# Patient Record
Sex: Male | Born: 2000 | Race: White | Hispanic: No | Marital: Single | Smoking: Never smoker
Health system: Southern US, Community
[De-identification: ages and names within clinical notes are randomized; demographics above are authoritative.]

## PROBLEM LIST (undated history)

## (undated) DIAGNOSIS — Q9388 Other microdeletions: Secondary | ICD-10-CM

## (undated) DIAGNOSIS — F840 Autistic disorder: Secondary | ICD-10-CM

## (undated) HISTORY — DX: Other microdeletions: Q93.88

---
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
## 2020-01-02 ENCOUNTER — Telehealth: Payer: Medicaid Other | Admitting: Family

## 2020-02-18 ENCOUNTER — Telehealth: Payer: Self-pay

## 2020-02-18 ENCOUNTER — Other Ambulatory Visit: Payer: Self-pay | Admitting: Pediatrics

## 2020-02-18 MED ORDER — GUANFACINE HCL ER 2 MG PO TB24: 2.0000 mg | ORAL_TABLET | Freq: Every day | ORAL | 1 refills | Status: DC

## 2020-02-18 MED ORDER — GUANFACINE HCL ER 1 MG PO TB24: 1.0000 mg | ORAL_TABLET | Freq: Every morning | ORAL | 1 refills | Status: DC

## 2020-02-18 NOTE — Telephone Encounter (Signed)
Done

## 2020-02-18 NOTE — Telephone Encounter (Signed)
Dad called requesting refill of medication. Routing to provider.

## 2020-02-20 ENCOUNTER — Telehealth: Payer: Medicaid Other | Admitting: Family

## 2020-03-20 ENCOUNTER — Other Ambulatory Visit: Payer: Self-pay | Admitting: Pediatrics

## 2020-03-21 ENCOUNTER — Telehealth: Payer: Medicaid Other | Admitting: Family

## 2020-04-24 ENCOUNTER — Telehealth: Payer: Self-pay | Admitting: Pediatrics

## 2020-04-24 ENCOUNTER — Other Ambulatory Visit: Payer: Self-pay | Admitting: Pediatrics

## 2020-04-24 NOTE — Telephone Encounter (Signed)
Needs Guanfacine 1 mg and 2 mg refilled please will be out on Sunday.

## 2020-04-24 NOTE — Telephone Encounter (Signed)
Meds refilled.

## 2020-06-30 ENCOUNTER — Other Ambulatory Visit: Payer: Self-pay | Admitting: Pediatrics

## 2020-09-17 ENCOUNTER — Other Ambulatory Visit: Payer: Self-pay | Admitting: Pediatrics

## 2020-12-04 ENCOUNTER — Other Ambulatory Visit: Payer: Self-pay | Admitting: Pediatrics

## 2020-12-20 ENCOUNTER — Other Ambulatory Visit: Payer: Self-pay

## 2020-12-20 ENCOUNTER — Encounter (HOSPITAL_BASED_OUTPATIENT_CLINIC_OR_DEPARTMENT_OTHER): Payer: Self-pay | Admitting: Emergency Medicine

## 2020-12-20 ENCOUNTER — Emergency Department (HOSPITAL_BASED_OUTPATIENT_CLINIC_OR_DEPARTMENT_OTHER)
Admission: EM | Admit: 2020-12-20 | Discharge: 2020-12-20 | Disposition: A | Discharge status: Home / Self Care | Payer: Medicaid Other | Attending: Emergency Medicine | Admitting: Emergency Medicine

## 2020-12-20 DIAGNOSIS — Z20822 Contact with and (suspected) exposure to covid-19: Secondary | ICD-10-CM | POA: Diagnosis not present

## 2020-12-20 DIAGNOSIS — R42 Dizziness and giddiness: Secondary | ICD-10-CM | POA: Diagnosis present

## 2020-12-20 DIAGNOSIS — E860 Dehydration: Secondary | ICD-10-CM

## 2020-12-20 DIAGNOSIS — F840 Autistic disorder: Secondary | ICD-10-CM | POA: Insufficient documentation

## 2020-12-20 DIAGNOSIS — R519 Headache, unspecified: Secondary | ICD-10-CM | POA: Insufficient documentation

## 2020-12-20 LAB — BASIC METABOLIC PANEL
Anion gap: 12 (ref 5–15)
BUN: 21 mg/dL — ABNORMAL HIGH (ref 6–20)
CO2: 26 mmol/L (ref 22–32)
Calcium: 10.1 mg/dL (ref 8.9–10.3)
Chloride: 104 mmol/L (ref 98–111)
Creatinine, Ser: 0.76 mg/dL (ref 0.61–1.24)
GFR, Estimated: >60 mL/min (ref >60)
Glucose, Bld: 87 mg/dL (ref 70–99)
Potassium: 3.7 mmol/L (ref 3.5–5.1)
Sodium: 142 mmol/L (ref 135–145)

## 2020-12-20 LAB — CBC WITH DIFFERENTIAL/PLATELET
Abs Immature Granulocytes: 0.02 10*3/uL (ref 0.00–0.07)
Basophils Absolute: 0.1 10*3/uL (ref 0.0–0.1)
Basophils Relative: 1 %
Eosinophils Absolute: 0 10*3/uL (ref 0.0–0.5)
Eosinophils Relative: 0 %
HCT: 57 % — ABNORMAL HIGH (ref 39.0–52.0)
Hemoglobin: 18.9 g/dL — ABNORMAL HIGH (ref 13.0–17.0)
Immature Granulocytes: 0 %
Lymphocytes Relative: 15 %
Lymphs Abs: 1.1 10*3/uL (ref 0.7–4.0)
MCH: 26.7 pg (ref 26.0–34.0)
MCHC: 33.2 g/dL (ref 30.0–36.0)
MCV: 80.6 fL (ref 80.0–100.0)
Monocytes Absolute: 0.5 10*3/uL (ref 0.1–1.0)
Monocytes Relative: 6 %
Neutro Abs: 6 10*3/uL (ref 1.7–7.7)
Neutrophils Relative %: 78 %
Platelets: 294 10*3/uL (ref 150–400)
RBC: 7.07 MIL/uL — ABNORMAL HIGH (ref 4.22–5.81)
RDW: 14.8 % (ref 11.5–15.5)
WBC: 7.7 10*3/uL (ref 4.0–10.5)
nRBC: 0 % (ref 0.0–0.2)

## 2020-12-20 LAB — RESP PANEL BY RT-PCR (FLU A&B, COVID) ARPGX2
Influenza A by PCR: NEGATIVE
Influenza B by PCR: NEGATIVE
SARS Coronavirus 2 by RT PCR: NEGATIVE

## 2020-12-20 MED ORDER — ONDANSETRON 4 MG PO TBDP: 4.0000 mg | ORAL_TABLET | ORAL | 0 refills | Status: DC | PRN

## 2020-12-20 MED ORDER — SODIUM CHLORIDE 0.9 % IV BOLUS
1000.0000 mL | Freq: Once | INTRAVENOUS | Status: AC
  Administered 2020-12-20: 1000 mL via INTRAVENOUS

## 2020-12-20 MED ORDER — FAMOTIDINE 20 MG PO TABS: 20.0000 mg | ORAL_TABLET | Freq: Two times a day (BID) | ORAL | 0 refills | Status: DC

## 2020-12-20 MED ORDER — MECLIZINE HCL 25 MG PO TABS
25.0000 mg | ORAL_TABLET | Freq: Once | ORAL | Status: AC
  Administered 2020-12-20: 25 mg via ORAL
  Filled 2020-12-20: qty 1

## 2020-12-20 MED ORDER — MECLIZINE HCL 25 MG PO TABS: 25.0000 mg | ORAL_TABLET | Freq: Three times a day (TID) | ORAL | 0 refills | Status: DC | PRN

## 2020-12-20 NOTE — ED Notes (Signed)
States not able to given urine sample at this time

## 2020-12-20 NOTE — ED Provider Notes (Signed)
Plan for rehydration and follow-up on labs with recheck. Physical Exam  BP (!) 139/92 (BP Location: Right Arm)   Pulse 95   Temp 98.3 F (36.8 C) (Oral)   Resp 16   SpO2 100%   Physical Exam  ED Course/Procedures     Procedures  MDM  Patient felt much improved after liter fluids and meclizine.  Symptoms were resolved.  At this time labs are suggestive of dehydration with hemoconcentration and mild increase in BUN.  Vital signs are stable.  Plan will be for treatment for nausea with rehydration instructions reviewed.  Patient is nontoxic and alert.  Clear mental status and agreeable with plan.  Careful return precautions reviewed.  Patient did get symptomatic improvement with meclizine thus it has been added to discharge medications.  At this time it does appear most consistent with dehydration.       Arby Barrette, MD 12/20/20 1755

## 2020-12-20 NOTE — Discharge Instructions (Addendum)
1.  Take Pepcid twice a day to help decrease stomach acid.  Take Zofran 4 mg every 4 hours as needed for nausea and vomiting.  Try to rehydrate with frequent small amounts of fluid.  You may start trying to eat bland foods as well.  If you feel dizzy with a spinning kind of quality, you may try the meclizine as prescribed.  This medication is for vertigo.  If it does not help your symptoms, do not take it. 2.  See your doctor for recheck within the next 3 to 5 days.  Return to the emergency department immediately if you cannot tolerate liquids or feel that you are becoming more dehydrated.  Return if you are having any episodes of passing out or worsening pain.
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
## 2020-12-20 NOTE — ED Triage Notes (Signed)
Dizzy/ headache/hard time when he stands. Feels like he is going to fall down.

## 2021-03-03 ENCOUNTER — Telehealth: Payer: Self-pay | Admitting: Pediatrics

## 2021-03-03 NOTE — Telephone Encounter (Signed)
Will refill once appt scheduled

## 2021-03-03 NOTE — Telephone Encounter (Signed)
Appt scheduled for Thursday the 17th

## 2021-03-03 NOTE — Telephone Encounter (Signed)
Dad called in regards to patients medicine refill, dad states patient gave him permission to call due to Tien being at work.  guanFACINE (INTUNIV) 2 MG TB24 ER tablet Please call Bryn at [phone] Thank you.

## 2021-03-03 NOTE — Telephone Encounter (Signed)
Pt has not been seen in over 1+ year. Will need to schedule follow up with clinic for med refill.

## 2021-03-05 ENCOUNTER — Other Ambulatory Visit: Payer: Self-pay

## 2021-03-05 ENCOUNTER — Encounter: Payer: Self-pay | Admitting: Family

## 2021-03-05 ENCOUNTER — Ambulatory Visit (INDEPENDENT_AMBULATORY_CARE_PROVIDER_SITE_OTHER): Payer: Medicaid Other | Admitting: Family

## 2021-03-05 VITALS — BP 139/88 | HR 79 | Ht 65.55 in | Wt 228.6 lb

## 2021-03-05 DIAGNOSIS — F902 Attention-deficit hyperactivity disorder, combined type: Secondary | ICD-10-CM | POA: Diagnosis not present

## 2021-03-05 DIAGNOSIS — F840 Autistic disorder: Secondary | ICD-10-CM

## 2021-03-05 MED ORDER — GUANFACINE HCL ER 2 MG PO TB24: 2.0000 mg | ORAL_TABLET | Freq: Every day | ORAL | 0 refills | Status: DC

## 2021-03-05 MED ORDER — GUANFACINE HCL ER 1 MG PO TB24: 1.0000 mg | ORAL_TABLET | Freq: Every morning | ORAL | 0 refills | Status: DC

## 2021-03-05 NOTE — Progress Notes (Signed)
History was provided by the patient.  Jacob [name] is a 20 y.o. male who is here for ADHD, combined type, autism.   PCP confirmed? Yes.    Jonetta Osgood, MD  HPI:   [employer] - custodian, works at night -went to [place] twice, [place], [place] - went by himself  -hasn't taken Intuniv since Friday  -likes works  -no headaches, no chest pain, no SOB  -asthma has been well-controlled   PHQ-SADS Last 3 Score only 03/05/2021 11/05/2019  PHQ-15 Score 0 0  Total GAD-7 Score 0 0  PHQ Adolescent Score 0 0    Patient Active Problem List   Diagnosis Date Noted   Aggressive outburst 10/09/2019   Attention deficit hyperactivity disorder (ADHD), combined type 04/05/2018   Pes planus of both feet 01/09/2015   Obesity 10/02/2012   Learning disability--FS IQ:  85 in 2011 10/02/2012   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 30 tablet 0   guanFACINE (INTUNIV) 1 MG TB24 ER tablet TAKE ONE TABLET BY MOUTH EVERY MORNING 31 tablet 1   guanFACINE (INTUNIV) 2 MG TB24 ER tablet TAKE ONE TABLET BY MOUTH EVERY NIGHT AT BEDTIME 31 tablet 1   meclizine (ANTIVERT) 25 MG tablet Take 1 tablet (25 mg total) by mouth 3 (three) times daily as needed for dizziness. (Patient not taking: Reported on 03/05/2021) 30 tablet 0   ondansetron (ZOFRAN ODT) 4 MG disintegrating tablet Take 1 tablet (4 mg total) by mouth every 4 (four) hours as needed for nausea or vomiting. (Patient not taking: Reported on 03/05/2021) 20 tablet 0   No current facility-administered medications on  file prior to visit.    No Known Allergies  Physical Exam:    Vitals:   03/05/21 0946  BP: 139/88  Pulse: 79  Weight: 228 lb 9.6 oz (103.7 kg)  Height: 5' 5.55" (1.665 m)   Wt Readings from Last 3 Encounters:  03/05/21 228 lb 9.6 oz (103.7 kg)  09/24/19 229 lb 6.4 oz (104.1 kg) (98 %, Z= 2.10)*  05/01/19 231 lb 9.6 oz (105.1 kg) (98 %, Z= 2.17)*   * Growth percentiles are based on CDC (Boys, 2-20 Years) data.     Growth percentile SmartLinks can only be used for patients less than 10 years old. No LMP for male patient.  Physical Exam Vitals reviewed.  Constitutional:      Appearance: Normal appearance.  HENT:     Head: Normocephalic.     Mouth/Throat:     Pharynx: Oropharynx is clear.  Eyes:     General: No scleral icterus.    Extraocular Movements: Extraocular movements intact.     Pupils: Pupils are equal, round, and reactive to light.  Cardiovascular:     Rate and Rhythm: Normal rate and regular rhythm.     Heart sounds: No murmur heard. Pulmonary:     Effort: Pulmonary effort is normal.  Musculoskeletal:        General: No swelling. Normal range of motion.     Cervical back: Normal range of motion.  Lymphadenopathy:     Cervical: No cervical adenopathy.  Skin:    General: Skin is warm and dry.     Capillary Refill: Capillary refill takes less than 2 seconds.     Findings: No lesion.  Neurological:     General: No focal deficit present.     Mental Status: He is alert and oriented to person, place, and time.  Psychiatric:        Mood and Affect: Mood normal.    Assessment/Plan: 1. Autism Attention deficit hyperactivity disorder (ADHD), combined type  - guanFACINE (INTUNIV) 2 MG TB24 ER tablet; Take 1 tablet (2 mg total) by mouth at bedtime.  Dispense: 90 tablet; Refill: 0 - guanFACINE (INTUNIV) 1 MG TB24 ER tablet; Take 1 tablet (1 mg total) by mouth every morning.  Dispense: 90 tablet; Refill: 0  -continue on current regimen  -return in 3 months

## 2021-03-29 ENCOUNTER — Emergency Department (HOSPITAL_COMMUNITY): Payer: Medicaid Other | Admitting: Anesthesiology

## 2021-03-29 ENCOUNTER — Other Ambulatory Visit: Payer: Self-pay

## 2021-03-29 ENCOUNTER — Encounter (HOSPITAL_COMMUNITY): Payer: Self-pay | Admitting: Emergency Medicine

## 2021-03-29 ENCOUNTER — Encounter (HOSPITAL_COMMUNITY)
Admission: EM | Disposition: A | Discharge status: Home / Self Care | Payer: Self-pay | Source: Home / Self Care | Attending: Emergency Medicine

## 2021-03-29 ENCOUNTER — Ambulatory Visit (HOSPITAL_COMMUNITY)
Admission: EM | Admit: 2021-03-29 | Discharge: 2021-03-29 | Disposition: A | Discharge status: Home / Self Care | Payer: Medicaid Other | Attending: Emergency Medicine | Admitting: Emergency Medicine

## 2021-03-29 DIAGNOSIS — K219 Gastro-esophageal reflux disease without esophagitis: Secondary | ICD-10-CM | POA: Insufficient documentation

## 2021-03-29 DIAGNOSIS — T18128A Food in esophagus causing other injury, initial encounter: Secondary | ICD-10-CM | POA: Diagnosis not present

## 2021-03-29 DIAGNOSIS — K56699 Other intestinal obstruction unspecified as to partial versus complete obstruction: Secondary | ICD-10-CM

## 2021-03-29 DIAGNOSIS — K2289 Other specified disease of esophagus: Secondary | ICD-10-CM | POA: Diagnosis not present

## 2021-03-29 DIAGNOSIS — K222 Esophageal obstruction: Secondary | ICD-10-CM

## 2021-03-29 DIAGNOSIS — X58XXXA Exposure to other specified factors, initial encounter: Secondary | ICD-10-CM | POA: Diagnosis not present

## 2021-03-29 DIAGNOSIS — R0602 Shortness of breath: Secondary | ICD-10-CM | POA: Diagnosis present

## 2021-03-29 DIAGNOSIS — K2970 Gastritis, unspecified, without bleeding: Secondary | ICD-10-CM | POA: Diagnosis not present

## 2021-03-29 DIAGNOSIS — Z20822 Contact with and (suspected) exposure to covid-19: Secondary | ICD-10-CM | POA: Insufficient documentation

## 2021-03-29 HISTORY — PX: ESOPHAGOGASTRODUODENOSCOPY (EGD) WITH PROPOFOL: SHX5813

## 2021-03-29 HISTORY — PX: BIOPSY: SHX5522

## 2021-03-29 LAB — RESP PANEL BY RT-PCR (FLU A&B, COVID) ARPGX2
Influenza A by PCR: NEGATIVE
Influenza B by PCR: NEGATIVE
SARS Coronavirus 2 by RT PCR: NEGATIVE

## 2021-03-29 LAB — POC SARS CORONAVIRUS 2 AG -  ED: SARSCOV2ONAVIRUS 2 AG: NEGATIVE

## 2021-03-29 SURGERY — ESOPHAGOGASTRODUODENOSCOPY (EGD) WITH PROPOFOL
Anesthesia: General

## 2021-03-29 MED ORDER — LACTATED RINGERS IV SOLN: INTRAVENOUS | Status: DC | PRN

## 2021-03-29 MED ORDER — MEPERIDINE HCL 50 MG/ML IJ SOLN: 6.2500 mg | INTRAMUSCULAR | Status: DC | PRN

## 2021-03-29 MED ORDER — PROPOFOL 1000 MG/100ML IV EMUL
INTRAVENOUS | Status: AC
  Filled 2021-03-29: qty 200

## 2021-03-29 MED ORDER — PROPOFOL 10 MG/ML IV BOLUS
INTRAVENOUS | Status: AC
  Filled 2021-03-29: qty 20

## 2021-03-29 MED ORDER — SUCCINYLCHOLINE CHLORIDE 200 MG/10ML IV SOSY
PREFILLED_SYRINGE | INTRAVENOUS | Status: AC
  Filled 2021-03-29: qty 10

## 2021-03-29 MED ORDER — DEXAMETHASONE SODIUM PHOSPHATE 4 MG/ML IJ SOLN
INTRAMUSCULAR | Status: AC
  Filled 2021-03-29: qty 2

## 2021-03-29 MED ORDER — ROCURONIUM BROMIDE 10 MG/ML (PF) SYRINGE
PREFILLED_SYRINGE | INTRAVENOUS | Status: AC
  Filled 2021-03-29: qty 10

## 2021-03-29 MED ORDER — PROPOFOL 10 MG/ML IV BOLUS
INTRAVENOUS | Status: DC | PRN
  Administered 2021-03-29: 20 mg via INTRAVENOUS
  Administered 2021-03-29: 200 mg via INTRAVENOUS

## 2021-03-29 MED ORDER — GLYCOPYRROLATE PF 0.2 MG/ML IJ SOSY
PREFILLED_SYRINGE | INTRAMUSCULAR | Status: DC | PRN
  Administered 2021-03-29 (×2): .1 mg via INTRAVENOUS

## 2021-03-29 MED ORDER — LIDOCAINE HCL (PF) 2 % IJ SOLN
INTRAMUSCULAR | Status: AC
  Filled 2021-03-29: qty 5

## 2021-03-29 MED ORDER — ONDANSETRON HCL 4 MG/2ML IJ SOLN
INTRAMUSCULAR | Status: AC
  Filled 2021-03-29: qty 2

## 2021-03-29 MED ORDER — FENTANYL CITRATE (PF) 100 MCG/2ML IJ SOLN
INTRAMUSCULAR | Status: AC
  Filled 2021-03-29: qty 2

## 2021-03-29 MED ORDER — GLUCAGON HCL RDNA (DIAGNOSTIC) 1 MG IJ SOLR
1.0000 mg | Freq: Once | INTRAMUSCULAR | Status: AC
  Administered 2021-03-29: 1 mg via INTRAVENOUS
  Filled 2021-03-29: qty 1

## 2021-03-29 MED ORDER — PHENYLEPHRINE 40 MCG/ML (10ML) SYRINGE FOR IV PUSH (FOR BLOOD PRESSURE SUPPORT)
PREFILLED_SYRINGE | INTRAVENOUS | Status: DC | PRN
  Administered 2021-03-29 (×5): 80 ug via INTRAVENOUS

## 2021-03-29 MED ORDER — HYDROMORPHONE HCL 1 MG/ML IJ SOLN: 0.2500 mg | INTRAMUSCULAR | Status: DC | PRN

## 2021-03-29 MED ORDER — ONDANSETRON HCL 4 MG/2ML IJ SOLN
INTRAMUSCULAR | Status: DC | PRN
  Administered 2021-03-29: 4 mg via INTRAVENOUS

## 2021-03-29 MED ORDER — ONDANSETRON HCL 4 MG/2ML IJ SOLN: 4.0000 mg | Freq: Once | INTRAMUSCULAR | Status: DC | PRN

## 2021-03-29 MED ORDER — LIDOCAINE HCL URETHRAL/MUCOSAL 2 % EX GEL
CUTANEOUS | Status: DC | PRN
  Administered 2021-03-29: 1 via INTRATRACHEAL

## 2021-03-29 MED ORDER — DEXAMETHASONE SODIUM PHOSPHATE 4 MG/ML IJ SOLN
INTRAMUSCULAR | Status: DC | PRN
  Administered 2021-03-29: 8 mg via INTRAVENOUS

## 2021-03-29 MED ORDER — OMEPRAZOLE 40 MG PO CPDR: 40.0000 mg | DELAYED_RELEASE_CAPSULE | Freq: Two times a day (BID) | ORAL | 5 refills | Status: DC

## 2021-03-29 MED ORDER — SUCCINYLCHOLINE CHLORIDE 200 MG/10ML IV SOSY
PREFILLED_SYRINGE | INTRAVENOUS | Status: DC | PRN
Start: 2021-03-29 — End: 2021-03-29
  Administered 2021-03-29: 140 mg via INTRAVENOUS

## 2021-03-29 MED ORDER — FENTANYL CITRATE (PF) 100 MCG/2ML IJ SOLN
INTRAMUSCULAR | Status: DC | PRN
  Administered 2021-03-29: 100 ug via INTRAVENOUS

## 2021-03-29 NOTE — Op Note (Signed)
[facility] Patient Name: Jacob [name] Procedure Date: 03/29/2021 3:25 PM MRN: [id number] Date of Birth: [birth year] Attending MD: Hennie Duos. Marletta Lor DO CSN: [id number] Age: 20 Admit Type: Outpatient Procedure:                Upper GI endoscopy Indications:              Foreign body in the esophagus Providers:                Hennie Duos. Marletta Lor, DO, Nena Polio, RN, Durwin Glaze Tech, Technician Referring MD:              Medicines:                See the Anesthesia note for documentation of the                            administered medications Complications:            No immediate complications. Estimated Blood Loss:     Estimated blood loss was minimal. Procedure:                Pre-Anesthesia Assessment:                           - The anesthesia plan was to use general anesthesia.                           After obtaining informed consent, the endoscope was                            passed under direct vision. Throughout the                            procedure, the patient's blood pressure, pulse, and                            oxygen saturations were monitored continuously. The                            [id number]-[id number] ([id number]) scope was introduced through the                            mouth, and advanced to the second part of duodenum.                            The upper GI endoscopy was accomplished without                            difficulty. The patient tolerated the procedure                            well. Scope In: 4:03:44 PM Scope Out: 4:40:38 PM Total Procedure Duration: 0 hours 36 minutes 54 seconds  Findings:      Food was found in the middle  third of the esophagus starting at approx       25 cm and in the lower third of the esophagus. Talon grasper initially       used without success. I then switched to the rat tooth grasper and began       removing food debris piece by piece. After approx 3-4 cm of food debris        removed, I again used the talon graspers to remove large chunks of food       debris until able to pass endoscope into stomach. Rest of food debris       was then pushed gently into the stomach.      Mucosal changes including ringed esophagus, feline appearance,       longitudinal furrows and small-caliber esophagus were found in the       middle third of the esophagus and in the lower third of the esophagus.       Esophageal findings were graded using the Eosinophilic Esophagitis       Endoscopic Reference Score (EoE-EREFS) as: Edema Grade 1 Present       (decreased clarity or absence of vascular markings), Rings Grade 2       Moderate (distinct rings that do not occlude passage of diagnostic 8-10       mm endoscope), Exudates Grade 1 Mild (scattered white lesions involving       less than 10 percent of the esophageal surface area), Furrows Grade 1       Present (vertical lines with or without visible depth) and Stricture       present. Biopsies were taken with a cold forceps for histology.      Diffuse mild inflammation characterized by erythema was found in the       stomach.      The duodenal bulb, first portion of the duodenum and second portion of       the duodenum were normal. Impression:               - Food in the middle third of the esophagus and in                            the lower third of the esophagus.                           - Esophageal mucosal changes consistent with                            eosinophilic esophagitis. Biopsied.                           - Gastritis.                           - Normal duodenal bulb, first portion of the                            duodenum and second portion of the duodenum. Moderate Sedation:      Per Anesthesia Care Recommendation:           - Patient has a contact number available for  emergencies. The signs and symptoms of potential                            delayed complications were discussed with  the                            patient. Return to normal activities tomorrow.                            Written discharge instructions were provided to the                            patient.                           - Resume previous diet.                           - Continue present medications.                           - Await pathology results.                           - Repeat upper endoscopy in 3 months to evaluate                            the response to therapy and for likely dilation.                           - Use Prilosec (omeprazole) 40 mg PO BID.                           - Return to GI clinic after studies are complete. Procedure Code(s):        --- Professional ---                           [phone], Esophagogastroduodenoscopy, flexible,                            transoral; with biopsy, single or multiple Diagnosis Code(s):        --- Professional ---                           [phone], Food in esophagus causing other injury,                            initial encounter                           K22.8, Other specified diseases of esophagus                           K29.70, Gastritis, unspecified, without bleeding  T18.108A, Unspecified foreign body in esophagus                            causing other injury, initial encounter CPT copyright 2019 American Medical Association. All rights reserved. The codes documented in this report are preliminary and upon coder review may  be revised to meet current compliance requirements. Hennie Duos. Marletta Lor, DO Hennie Duos. Marletta Lor, DO 03/29/2021 4:48:55 PM This report has been signed electronically. Number of Addenda: 0

## 2021-03-29 NOTE — ED Triage Notes (Signed)
Pt was eating lunch and got choked on lunch unable to keep down fluids. Pt states when he drink he vomits. Unable to get food to go down.

## 2021-03-29 NOTE — Discharge Instructions (Signed)
EGD Discharge instructions Please read the instructions outlined below and refer to this sheet in the next few weeks. These discharge instructions provide you with general information on caring for yourself after you leave the hospital. Your doctor may also give you specific instructions. While your treatment has been planned according to the most current medical practices available, unavoidable complications occasionally occur. If you have any problems or questions after discharge, please call your doctor. ACTIVITY You may resume your regular activity but move at a slower pace for the next 24 hours.  Take frequent rest periods for the next 24 hours.  Walking will help expel (get rid of) the air and reduce the bloated feeling in your abdomen.  No driving for 24 hours (because of the anesthesia (medicine) used during the test).  You may shower.  Do not sign any important legal documents or operate any machinery for 24 hours (because of the anesthesia used during the test).  NUTRITION Drink plenty of fluids.  You may resume your normal diet.  Begin with a light meal and progress to your normal diet.  Avoid alcoholic beverages for 24 hours or as instructed by your caregiver.  MEDICATIONS You may resume your normal medications unless your caregiver tells you otherwise.  WHAT YOU CAN EXPECT TODAY You may experience abdominal discomfort such as a feeling of fullness or "gas" pains.  FOLLOW-UP Your doctor will discuss the results of your test with you.  SEEK IMMEDIATE MEDICAL ATTENTION IF ANY OF THE FOLLOWING OCCUR: Excessive nausea (feeling sick to your stomach) and/or vomiting.  Severe abdominal pain and distention (swelling).  Trouble swallowing.  Temperature over 101 F (37.8 C).  Rectal bleeding or vomiting of blood.    You had a significant amount of beef stuck in your esophagus.  I successfully removed this.  After removing the obstruction, I carefully examined your esophagus and  appears you may have a condition called eosinophilic esophagitis which is an allergy mediated process.  I did take biopsies of your esophagus and we will contact you with these results.    I am going to start you on omeprazole 40 mg twice daily.  This medicine works best if you take it 30 minutes for breakfast and 30 minutes before dinner.  We will need to repeat EGD in 10 to 12 weeks.  I may need to dilate your esophagus at that time.  In the meantime I would cut your food into very small pieces.  My office will contact you this week to set up repeat procedure.  I hope you have a great rest of your week!  Hennie Duos. Marletta Lor, D.O. Gastroenterology and Hepatology [facility]

## 2021-03-29 NOTE — Anesthesia Postprocedure Evaluation (Signed)
Anesthesia Post Note  Patient: Muzammil Bruins  Procedure(s) Performed: ESOPHAGOGASTRODUODENOSCOPY (EGD) WITH PROPOFOL  Patient location during evaluation: PACU Anesthesia Type: General Level of consciousness: awake and alert, patient cooperative and responds to stimulation Pain management: pain level controlled Vital Signs Assessment: post-procedure vital signs reviewed and stable Respiratory status: spontaneous breathing, nonlabored ventilation, respiratory function stable and patient connected to nasal cannula oxygen Cardiovascular status: blood pressure returned to baseline and stable Postop Assessment: no apparent nausea or vomiting Anesthetic complications: no   No notable events documented.   Last Vitals:  Vitals:   03/29/21 1546 03/29/21 1700  BP: (!) 145/101 (!) 127/57  Pulse: (!) 133 (!) 104  Resp: 18 12  Temp:  36.9 C  SpO2: 99% 96%    Last Pain:  Vitals:   03/29/21 1700  TempSrc:   PainSc: 0-No pain                 Yutaka Holberg C Stacie Templin

## 2021-03-29 NOTE — Consult Note (Signed)
Consulting  Provider: Berle Mull, ER provider Primary Care Physician:  Jonetta Osgood, MD Primary Gastroenterologist: Previously unassigned, Dr. Marletta Lor  Reason for Consultation: Food impaction  HPI:  Jacob [name] is a 20 y.o. male with a past medical history of high functioning autism, otherwise healthy who presented to [facility], ER today with esophageal food impaction.  Patient states he was eating lunch today, beef and broccoli when felt as though his food got stuck in his substernal region.  Since that time he has not been able to hold anything down including liquids, food, saliva.  Notes consistent regurgitation since that time.  No chest or epigastric pain. Does chronically take famotidine for acid reflux.  Denies ever having food impaction in the past.  Does note dysphagia on occasion though.  No chronic NSAID use.  No history of H. pylori.  In the ER, hemodynamically stable, slightly tachycardic.    Past Medical History:  Diagnosis Date   Autism    Microdeletion syndrome     History reviewed. No pertinent surgical history.  Prior to Admission medications   Medication Sig Start Date End Date Taking? Authorizing Provider  cetirizine (ZYRTEC) 10 MG tablet TAKE ONE TABLET BY MOUTH DAILY 03/21/20   Ancil Linsey, MD  famotidine (PEPCID) 20 MG tablet Take 1 tablet (20 mg total) by mouth 2 (two) times daily. 12/20/20   Arby Barrette, MD  guanFACINE (INTUNIV) 1 MG TB24 ER tablet Take 1 tablet (1 mg total) by mouth every morning. 03/05/21   Georges Mouse, NP  guanFACINE (INTUNIV) 2 MG TB24 ER tablet Take 1 tablet (2 mg total) by mouth at bedtime. 03/05/21   Georges Mouse, NP    No current facility-administered medications for this encounter.   Current Outpatient Medications  Medication Sig Dispense Refill   cetirizine (ZYRTEC) 10 MG tablet TAKE ONE TABLET BY MOUTH DAILY 30 tablet 9   famotidine (PEPCID) 20 MG tablet Take 1 tablet (20 mg total) by mouth 2 (two) times  daily. 30 tablet 0   guanFACINE (INTUNIV) 1 MG TB24 ER tablet Take 1 tablet (1 mg total) by mouth every morning. 90 tablet 0   guanFACINE (INTUNIV) 2 MG TB24 ER tablet Take 1 tablet (2 mg total) by mouth at bedtime. 90 tablet 0    Allergies as of 03/29/2021   (No Known Allergies)    No family history on file.  Social History   Socioeconomic History   Marital status: Single    Spouse name: Not on file   Number of children: Not on file   Years of education: Not on file   Highest education level: Not on file  Occupational History   Not on file  Tobacco Use   Smoking status: Never   Smokeless tobacco: Never   Tobacco comments:    mom and dad outside   Substance and Sexual Activity   Alcohol use: No    Alcohol/week: 0.0 standard drinks   Drug use: No   Sexual activity: Never  Other Topics Concern   Not on file  Social History Narrative   Not on file   Social Determinants of Health   Financial Resource Strain: Not on file  Food Insecurity: Not on file  Transportation Needs: Not on file  Physical Activity: Not on file  Stress: Not on file  Social Connections: Not on file  Intimate Partner Violence: Not on file    Review of Systems: General: Negative for anorexia, weight loss, fever, chills, fatigue, weakness.  Eyes: Negative for vision changes.  ENT: Negative for hoarseness, difficulty swallowing , nasal congestion. CV: Negative for chest pain, angina, palpitations, dyspnea on exertion, peripheral edema.  Respiratory: Negative for dyspnea at rest, dyspnea on exertion, cough, sputum, wheezing.  GI: See history of present illness. GU:  Negative for dysuria, hematuria, urinary incontinence, urinary frequency, nocturnal urination.  MS: Negative for joint pain, low back pain.  Derm: Negative for rash or itching.  Neuro: Negative for weakness, abnormal sensation, seizure, frequent headaches, memory loss, confusion.  Psych: Negative for anxiety, depression Endo: Negative  for unusual weight change.  Heme: Negative for bruising or bleeding. Allergy: Negative for rash or hives.  Physical Exam: Vital signs in last 24 hours: Temp:  [99.5 F (37.5 C)] 99.5 F (37.5 C) (12/11 1359) Pulse Rate:  [106-120] 109 (12/11 1445) Resp:  [16-36] 16 (12/11 1430) BP: (141-147)/(85-92) 141/85 (12/11 1430) SpO2:  [98 %-100 %] 98 % (12/11 1445) Weight:  [104.3 kg] 104.3 kg (12/11 1356)   General:   Alert,  Well-developed, well-nourished, pleasant and cooperative, appears uncomfortable Head:  Normocephalic and atraumatic. Eyes:  Sclera clear, no icterus.   Conjunctiva pink. Ears:  Normal auditory acuity. Nose:  No deformity, discharge,  or lesions. Mouth:  No deformity or lesions, dentition normal. Neck:  Supple; no masses or thyromegaly. Lungs:  Clear throughout to auscultation.   No wheezes, crackles, or rhonchi. No acute distress. Heart:  Regular rate and rhythm; no murmurs, clicks, rubs,  or gallops. Abdomen:  Soft, nontender and nondistended. No masses, hepatosplenomegaly or hernias noted. Normal bowel sounds, without guarding, and without rebound.   Msk:  Symmetrical without gross deformities. Normal posture. Pulses:  Normal pulses noted. Extremities:  Without clubbing or edema. Neurologic:  Alert and  oriented x4;  grossly normal neurologically. Skin:  Intact without significant lesions or rashes. Cervical Nodes:  No significant cervical adenopathy. Psych:  Alert and cooperative. Normal mood and affect.  Intake/Output from previous day: No intake/output data recorded. Intake/Output this shift: No intake/output data recorded.  Lab Results: No results for input(s): WBC, HGB, HCT, PLT in the last 72 hours. BMET No results for input(s): NA, K, CL, CO2, GLUCOSE, BUN, CREATININE, CALCIUM in the last 72 hours. LFT No results for input(s): PROT, ALBUMIN, AST, ALT, ALKPHOS, BILITOT, BILIDIR, IBILI in the last 72 hours. PT/INR No results for input(s): LABPROT, INR  in the last 72 hours. Hepatitis Panel No results for input(s): HEPBSAG, HCVAB, HEPAIGM, HEPBIGM in the last 72 hours. C-Diff No results for input(s): CDIFFTOX in the last 72 hours.  Studies/Results: No results found.  Impression: *Esophageal food impaction  Plan: We will proceed with urgent endoscopy for impaction removal.  The risks including infection, bleed, or perforation as well as benefits, limitations, alternatives and imponderables have been reviewed with the patient and grandfather who is bedside. Potential for esophageal dilation, biopsy, etc. have also been reviewed.  Questions have been answered. All parties agreeable.  Elon Alas. Abbey Chatters, D.O. Gastroenterology and Hepatology [facility]    LOS: 0 days     03/29/2021, 2:54 PM

## 2021-03-29 NOTE — ED Notes (Signed)
Dr. Marletta Lor in the room

## 2021-03-29 NOTE — Interval H&P Note (Signed)
History and Physical Interval Note:  03/29/2021 3:30 PM  Jacob [name]  has presented today for surgery, with the diagnosis of Esophageal food impaction.  The various methods of treatment have been discussed with the patient and family. After consideration of risks, benefits and other options for treatment, the patient has consented to  Procedure(s): ESOPHAGOGASTRODUODENOSCOPY (EGD) WITH PROPOFOL (N/A) as a surgical intervention.  The patient's history has been reviewed, patient examined, no change in status, stable for surgery.  I have reviewed the patient's chart and labs.  Questions were answered to the patient's satisfaction.     Lanelle Bal

## 2021-03-29 NOTE — Progress Notes (Signed)
Patient was seen at [facility] and had a procedure with anesthesia.  He can return to work on 03/31/21.

## 2021-03-29 NOTE — Anesthesia Procedure Notes (Signed)
Procedure Name: Intubation Date/Time: 03/29/2021 3:57 PM Performed by: Molli Barrows, MD Pre-anesthesia Checklist: Patient identified, Emergency Drugs available, Suction available and Patient being monitored Patient Re-evaluated:Patient Re-evaluated prior to induction Oxygen Delivery Method: Circle system utilized Preoxygenation: Pre-oxygenation with 100% oxygen Induction Type: IV induction and Cricoid Pressure applied Laryngoscope Size: Glidescope and 4 Grade View: Grade I Tube type: Oral Tube size: 7.5 mm Number of attempts: 1 Airway Equipment and Method: Stylet and Oral airway Placement Confirmation: ETT inserted through vocal cords under direct vision, positive ETCO2 and breath sounds checked- equal and bilateral Tube secured with: Tape Dental Injury: Teeth and Oropharynx as per pre-operative assessment

## 2021-03-29 NOTE — H&P (View-Only) (Signed)
Consulting  Provider: Berle Mull, ER provider Primary Care Physician:  Jonetta Osgood, MD Primary Gastroenterologist: Previously unassigned, Dr. Marletta Lor  Reason for Consultation: Food impaction  HPI:  Jacob [name] is a 20 y.o. male with a past medical history of high functioning autism, otherwise healthy who presented to [facility], ER today with esophageal food impaction.  Patient states he was eating lunch today, beef and broccoli when felt as though his food got stuck in his substernal region.  Since that time he has not been able to hold anything down including liquids, food, saliva.  Notes consistent regurgitation since that time.  No chest or epigastric pain. Does chronically take famotidine for acid reflux.  Denies ever having food impaction in the past.  Does note dysphagia on occasion though.  No chronic NSAID use.  No history of H. pylori.  In the ER, hemodynamically stable, slightly tachycardic.    Past Medical History:  Diagnosis Date   Autism    Microdeletion syndrome     History reviewed. No pertinent surgical history.  Prior to Admission medications   Medication Sig Start Date End Date Taking? Authorizing Provider  cetirizine (ZYRTEC) 10 MG tablet TAKE ONE TABLET BY MOUTH DAILY 03/21/20   Ancil Linsey, MD  famotidine (PEPCID) 20 MG tablet Take 1 tablet (20 mg total) by mouth 2 (two) times daily. 12/20/20   Arby Barrette, MD  guanFACINE (INTUNIV) 1 MG TB24 ER tablet Take 1 tablet (1 mg total) by mouth every morning. 03/05/21   Georges Mouse, NP  guanFACINE (INTUNIV) 2 MG TB24 ER tablet Take 1 tablet (2 mg total) by mouth at bedtime. 03/05/21   Georges Mouse, NP    No current facility-administered medications for this encounter.   Current Outpatient Medications  Medication Sig Dispense Refill   cetirizine (ZYRTEC) 10 MG tablet TAKE ONE TABLET BY MOUTH DAILY 30 tablet 9   famotidine (PEPCID) 20 MG tablet Take 1 tablet (20 mg total) by mouth 2 (two) times  daily. 30 tablet 0   guanFACINE (INTUNIV) 1 MG TB24 ER tablet Take 1 tablet (1 mg total) by mouth every morning. 90 tablet 0   guanFACINE (INTUNIV) 2 MG TB24 ER tablet Take 1 tablet (2 mg total) by mouth at bedtime. 90 tablet 0    Allergies as of 03/29/2021   (No Known Allergies)    No family history on file.  Social History   Socioeconomic History   Marital status: Single    Spouse name: Not on file   Number of children: Not on file   Years of education: Not on file   Highest education level: Not on file  Occupational History   Not on file  Tobacco Use   Smoking status: Never   Smokeless tobacco: Never   Tobacco comments:    mom and dad outside   Substance and Sexual Activity   Alcohol use: No    Alcohol/week: 0.0 standard drinks   Drug use: No   Sexual activity: Never  Other Topics Concern   Not on file  Social History Narrative   Not on file   Social Determinants of Health   Financial Resource Strain: Not on file  Food Insecurity: Not on file  Transportation Needs: Not on file  Physical Activity: Not on file  Stress: Not on file  Social Connections: Not on file  Intimate Partner Violence: Not on file    Review of Systems: General: Negative for anorexia, weight loss, fever, chills, fatigue, weakness.  Eyes: Negative for vision changes.  ENT: Negative for hoarseness, difficulty swallowing , nasal congestion. CV: Negative for chest pain, angina, palpitations, dyspnea on exertion, peripheral edema.  Respiratory: Negative for dyspnea at rest, dyspnea on exertion, cough, sputum, wheezing.  GI: See history of present illness. GU:  Negative for dysuria, hematuria, urinary incontinence, urinary frequency, nocturnal urination.  MS: Negative for joint pain, low back pain.  Derm: Negative for rash or itching.  Neuro: Negative for weakness, abnormal sensation, seizure, frequent headaches, memory loss, confusion.  Psych: Negative for anxiety, depression Endo: Negative  for unusual weight change.  Heme: Negative for bruising or bleeding. Allergy: Negative for rash or hives.  Physical Exam: Vital signs in last 24 hours: Temp:  [99.5 F (37.5 C)] 99.5 F (37.5 C) (12/11 1359) Pulse Rate:  [106-120] 109 (12/11 1445) Resp:  [16-36] 16 (12/11 1430) BP: (141-147)/(85-92) 141/85 (12/11 1430) SpO2:  [98 %-100 %] 98 % (12/11 1445) Weight:  [104.3 kg] 104.3 kg (12/11 1356)   General:   Alert,  Well-developed, well-nourished, pleasant and cooperative, appears uncomfortable Head:  Normocephalic and atraumatic. Eyes:  Sclera clear, no icterus.   Conjunctiva pink. Ears:  Normal auditory acuity. Nose:  No deformity, discharge,  or lesions. Mouth:  No deformity or lesions, dentition normal. Neck:  Supple; no masses or thyromegaly. Lungs:  Clear throughout to auscultation.   No wheezes, crackles, or rhonchi. No acute distress. Heart:  Regular rate and rhythm; no murmurs, clicks, rubs,  or gallops. Abdomen:  Soft, nontender and nondistended. No masses, hepatosplenomegaly or hernias noted. Normal bowel sounds, without guarding, and without rebound.   Msk:  Symmetrical without gross deformities. Normal posture. Pulses:  Normal pulses noted. Extremities:  Without clubbing or edema. Neurologic:  Alert and  oriented x4;  grossly normal neurologically. Skin:  Intact without significant lesions or rashes. Cervical Nodes:  No significant cervical adenopathy. Psych:  Alert and cooperative. Normal mood and affect.  Intake/Output from previous day: No intake/output data recorded. Intake/Output this shift: No intake/output data recorded.  Lab Results: No results for input(s): WBC, HGB, HCT, PLT in the last 72 hours. BMET No results for input(s): NA, K, CL, CO2, GLUCOSE, BUN, CREATININE, CALCIUM in the last 72 hours. LFT No results for input(s): PROT, ALBUMIN, AST, ALT, ALKPHOS, BILITOT, BILIDIR, IBILI in the last 72 hours. PT/INR No results for input(s): LABPROT, INR  in the last 72 hours. Hepatitis Panel No results for input(s): HEPBSAG, HCVAB, HEPAIGM, HEPBIGM in the last 72 hours. C-Diff No results for input(s): CDIFFTOX in the last 72 hours.  Studies/Results: No results found.  Impression: *Esophageal food impaction  Plan: We will proceed with urgent endoscopy for impaction removal.  The risks including infection, bleed, or perforation as well as benefits, limitations, alternatives and imponderables have been reviewed with the patient and grandfather who is bedside. Potential for esophageal dilation, biopsy, etc. have also been reviewed.  Questions have been answered. All parties agreeable.  Elon Alas. Abbey Chatters, D.O. Gastroenterology and Hepatology [facility]    LOS: 0 days     03/29/2021, 2:54 PM

## 2021-03-29 NOTE — ED Provider Notes (Signed)
[place] ENDOSCOPY Provider Note   CSN: [id number] Arrival date & time: 03/29/21  1345     History Chief Complaint  Patient presents with   Shortness of Breath   Choking    Jacob [name] is a 20 y.o. male.  HPI  Patient with medical history including autism, learning disability, presents with complaints of inability to swallow.  Patient states that today while he was eating lunch he was having cheese, broccoli, and beef he ate these at the same time and felt as if he got stuck in his throat.  He started cough was able to cough some of it up.  He then went to the bathroom and started to vomit.  He states that he is now unable to swallow as he feels it  get stuck and he has to spit it back up.  He states he is unable to eat or drink.  He states that he is not able to swallow because of pain but because  the food physical will not go down, he states he does have some pain when he swallows, no difficulty breathing, has no chest pain, shortness of breath, has no other complaints.  He denies  alleviating or aggravating factors.  He is never had this happen to him in the past.  He does not endorse fevers, chills, chest pain, shortness of breath, abdominal pain.  Patient's grandfather was at bedside able to validate the story.  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 No pertinent surgical  history.     No family history on file.  Social History   Tobacco Use   Smoking status: Never   Smokeless tobacco: Never   Tobacco comments:    mom and dad outside   Substance Use Topics   Alcohol use: No    Alcohol/week: 0.0 standard drinks   Drug use: No    Home Medications Prior to Admission medications   Medication Sig Start Date End Date Taking? Authorizing Provider  cetirizine (ZYRTEC) 10 MG tablet TAKE ONE TABLET BY MOUTH DAILY 03/21/20  Yes Ancil Linsey, MD  guanFACINE (INTUNIV) 1 MG TB24 ER tablet Take 1 tablet (1 mg total) by mouth every morning. 03/05/21  Yes Georges Mouse, NP  guanFACINE (INTUNIV) 2 MG TB24 ER tablet Take 1 tablet (2 mg total) by mouth at bedtime. 03/05/21  Yes Georges Mouse, NP  omeprazole (PRILOSEC) 40 MG capsule Take 1 capsule (40 mg total) by mouth 2 (two) times daily before a meal. 03/29/21 09/25/21 Yes Carver, Hennie Duos, DO  famotidine (PEPCID) 20 MG tablet Take 1 tablet (20 mg total) by mouth 2 (two) times daily. Patient not taking: Reported on 03/29/2021 12/20/20   Arby Barrette, MD    Allergies    Patient has no known allergies.  Review of Systems   Review of Systems  Constitutional:  Negative for  chills and fever.  HENT:  Positive for trouble swallowing. Negative for congestion, sore throat, tinnitus and voice change.   Respiratory:  Negative for shortness of breath.   Cardiovascular:  Negative for chest pain.  Gastrointestinal:  Positive for vomiting. Negative for abdominal pain, diarrhea and nausea.  Genitourinary:  Negative for enuresis.  Musculoskeletal:  Negative for back pain.  Skin:  Negative for rash.  Neurological:  Negative for dizziness.  Hematological:  Does not bruise/bleed easily.   Physical Exam Updated Vital Signs BP 128/68   Pulse (!) 106   Temp 98.5 F (36.9 C)   Resp 16   Ht 5\' 4"  (1.626 m)   Wt 104.3 kg   SpO2 97%   BMI 39.48 kg/m   Physical Exam Vitals and nursing note reviewed.   Constitutional:      General: He is not in acute distress.    Appearance: He is not ill-appearing.     Comments: Patient is alert, no acute distress, he is noted be tachycardic and tachypneic, able to speak in full sentences, actively spitting into a bag.  HENT:     Head: Normocephalic and atraumatic.     Nose: No congestion.     Mouth/Throat:     Mouth: Mucous membranes are moist.     Pharynx: Oropharynx is clear. No oropharyngeal exudate or posterior oropharyngeal erythema.     Comments: No trismus or torticollis, oropharynx visualized tongue and uvula are both midline, with saliva pooling in the front of his mouth, tonsils were equal and symmetrical bilaterally, no tongue elevation. Eyes:     Conjunctiva/sclera: Conjunctivae normal.  Cardiovascular:     Rate and Rhythm: Normal rate and regular rhythm.     Pulses: Normal pulses.     Heart sounds: No murmur heard.   No friction rub. No gallop.  Pulmonary:     Effort: No respiratory distress.     Breath sounds: No wheezing, rhonchi or rales.  Abdominal:     Palpations: Abdomen is soft.     Tenderness: There is no abdominal tenderness. There is no right CVA tenderness or left CVA tenderness.  Skin:    General: Skin is warm and dry.  Neurological:     Mental Status: He is alert.  Psychiatric:        Mood and Affect: Mood normal.    ED Results / Procedures / Treatments   Labs (all labs ordered are listed, but only abnormal results are displayed) Labs Reviewed  RESP PANEL BY RT-PCR (FLU A&B, COVID) [id number]  POC SARS CORONAVIRUS 2 AG -  ED  SURGICAL PATHOLOGY    EKG EKG Interpretation  Date/Time:  Sunday March 29 2021 14:00:10 EST Ventricular Rate:  93 PR Interval:  148 QRS Duration: 88 QT Interval:  329 QTC Calculation: 410 R Axis:   37 Text Interpretation: Sinus arrhythmia Consider left atrial enlargement No significant change since prior 9/22 Confirmed by 10/22 [phone]) on 03/29/2021 2:56:02  PM  Radiology No results found.  Procedures Procedures   Medications Ordered in ED Medications  HYDROmorphone (DILAUDID) injection 0.25-0.5 mg (has no administration in time range)  meperidine (DEMEROL) injection 6.25-12.5 mg (has no administration in time range)  ondansetron (ZOFRAN) injection 4 mg (has no administration in time range)  glucagon (human recombinant) (GLUCAGEN) injection 1 mg (1 mg Intravenous Given 03/29/21 1434)    ED Course  I have reviewed the triage vital signs and the nursing notes.  Pertinent labs & imaging results that were available  during my care of the patient were reviewed by me and considered in my medical decision making (see chart for details).    MDM Rules/Calculators/A&P                          Initial impression-he is alert, no acute distress, vital signs significant for tachycardia and tachypnea.  Concern for food bolus, will obtain respiratory panel, provide with glucagon consult with GI for further recommendations.  Work-up-respiratory panel negative for COVID, influenza a and B.  Reassessment-patient was given water and immediately spit it back up, there is no evidence of respiratory compromise, cannot visualize food bolus.   Patient reassessed after glucagon so was unable to tolerate p.o., will consult with GI for further recommendations.  Consult-spoke with Dr. Marletta Lor of GI who will come and assess the patient.  GI will take the patient to the colonoscopy suite for EDG.  Rule out-I have low suspicion for systemic infection as patient is nontoxic-appearing, no signs of infection present.  He is noted to be tachycardic slightly tachypneic but I suspect this is likely secondary due to the food bolus in his throat.  I have low suspicion for foreign body in the airway is no stridor or wheezing present my exam.  Suspicion for anaphylactic shock there is no tongue or throat swelling, no systemic rash, vital signs are reassuring.  Plan-will  undergo EDG for further investigation food bolus. Final Clinical Impression(s) / ED Diagnoses Final diagnoses:  None    Rx / DC Orders ED Discharge Orders          Ordered    omeprazole (PRILOSEC) 40 MG capsule  2 times daily before meals        03/29/21 1651             Barnie Del 03/29/21 1733    Cheryll Cockayne, MD 03/30/21 2348

## 2021-03-29 NOTE — Anesthesia Preprocedure Evaluation (Signed)
Anesthesia Evaluation  Patient identified by MRN, date of birth, ID band Patient awake    Reviewed: Allergy & Precautions, NPO status , Patient's Chart, lab work & pertinent test results  History of Anesthesia Complications Negative for: history of anesthetic complications  Airway Mallampati: III  TM Distance: >3 FB Neck ROM: Full    Dental  (+) Dental Advisory Given, Teeth Intact   Pulmonary neg pulmonary ROS,    Pulmonary exam normal breath sounds clear to auscultation       Cardiovascular Exercise Tolerance: Good  Rhythm:Irregular Rate:Tachycardia  29-Mar-2021 14:00:10 [facility] ROUTINE RECORD 10/24/2000 (20 yr) Male Caucasian Vent. rate 93 BPM PR interval 148 ms QRS duration 88 ms QT/QTcB 329/410 ms P-R-T axes 38 37 52 Sinus arrhythmia Consider left atrial enlargement No significant change since prior 9/22 Confirmed by Meridee Score [phone]) on 03/29/2021 2:56:02 PM   Neuro/Psych PSYCHIATRIC DISORDERS (autism) negative neurological ROS     GI/Hepatic Neg liver ROS, GERD  Medicated,  Endo/Other  negative endocrine ROS  Renal/GU negative Renal ROS     Musculoskeletal negative musculoskeletal ROS (+)   Abdominal   Peds  (+) ADHD Hematology negative hematology ROS (+)   Anesthesia Other Findings   Reproductive/Obstetrics negative OB ROS                             Anesthesia Physical Anesthesia Plan  ASA: 3 and emergent  Anesthesia Plan: General   Post-op Pain Management:    Induction: Intravenous, Rapid sequence and Cricoid pressure planned  PONV Risk Score and Plan: Ondansetron and Dexamethasone  Airway Management Planned: Oral ETT and Video Laryngoscope Planned  Additional Equipment:   Intra-op Plan:   Post-operative Plan: Extubation in OR  Informed Consent: I have reviewed the patients History and Physical, chart, labs and discussed the  procedure including the risks, benefits and alternatives for the proposed anesthesia with the patient or authorized representative who has indicated his/her understanding and acceptance.     Dental advisory given  Plan Discussed with: Surgeon  Anesthesia Plan Comments:         Anesthesia Quick Evaluation

## 2021-03-29 NOTE — ED Triage Notes (Signed)
Pt was eating lunch and choked on a piece of meat. Unable to get it down. Everything he drinks he throws up. Denies any SOB.

## 2021-03-29 NOTE — Transfer of Care (Signed)
Immediate Anesthesia Transfer of Care Note  Patient: Jacob [name]  Procedure(s) Performed: ESOPHAGOGASTRODUODENOSCOPY (EGD) WITH PROPOFOL  Patient Location: PACU  Anesthesia Type:General  Level of Consciousness: awake, drowsy and patient cooperative  Airway & Oxygen Therapy: Patient Spontanous Breathing and Patient connected to nasal cannula oxygen  Post-op Assessment: Report given to RN and Post -op Vital signs reviewed and stable  Post vital signs: Reviewed and stable  Last Vitals:  Vitals Value Taken Time  BP 127/57 03/29/21 1700  Temp 36.9 C 03/29/21 1700  Pulse 100 03/29/21 1705  Resp 25 03/29/21 1705  SpO2 99 % 03/29/21 1705  Vitals shown include unvalidated device data.  Last Pain:  Vitals:   03/29/21 1700  TempSrc:   PainSc: 0-No pain      Patients Stated Pain Goal: 7 (03/29/21 1546)  Complications: No notable events documented.

## 2021-03-29 NOTE — ED Notes (Signed)
Pt transported to Endo with family member at the side.

## 2021-03-30 ENCOUNTER — Encounter (HOSPITAL_COMMUNITY): Payer: Self-pay | Admitting: Internal Medicine

## 2021-04-01 ENCOUNTER — Encounter: Payer: Self-pay | Admitting: Internal Medicine

## 2021-04-01 LAB — SURGICAL PATHOLOGY

## 2021-05-26 ENCOUNTER — Telehealth: Payer: Self-pay

## 2021-05-26 NOTE — Telephone Encounter (Signed)
Called pt, repeat EGD w/Propofol ASA 2 w/Dr. Abbey Chatters scheduled for 07/07/21 at 2:00pm. Pt requested a work note for day of procedure. Work note and procedure instructions mailed to pt. Orders entered.

## 2021-06-05 ENCOUNTER — Other Ambulatory Visit: Payer: Self-pay

## 2021-06-05 ENCOUNTER — Encounter: Payer: Self-pay | Admitting: Family

## 2021-06-05 ENCOUNTER — Ambulatory Visit (INDEPENDENT_AMBULATORY_CARE_PROVIDER_SITE_OTHER): Payer: Medicaid Other | Admitting: Family

## 2021-06-05 VITALS — BP 118/68 | HR 107 | Ht 65.0 in | Wt 238.5 lb

## 2021-06-05 DIAGNOSIS — F902 Attention-deficit hyperactivity disorder, combined type: Secondary | ICD-10-CM | POA: Diagnosis not present

## 2021-06-05 DIAGNOSIS — F840 Autistic disorder: Secondary | ICD-10-CM

## 2021-06-05 MED ORDER — GUANFACINE HCL ER 2 MG PO TB24: 2.0000 mg | ORAL_TABLET | Freq: Every day | ORAL | 0 refills | Status: DC

## 2021-06-05 MED ORDER — GUANFACINE HCL ER 1 MG PO TB24: 1.0000 mg | ORAL_TABLET | Freq: Every morning | ORAL | 0 refills | Status: DC

## 2021-06-05 NOTE — Progress Notes (Signed)
History was provided by the patient.  Jacob [name] is a 21 y.o. male who is here for ADHD, combined type, ASD.   PCP confirmed? Yes.    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 -working from [employer] -sleeping OK  -taking medications  -since last visit, had choking episode with repeat EGD scheduled for 07/07/21 to find out if he will need to continue on Prilosec  -no chest pain, no headaches -no choking; has intermittent non-productive dry cough throughout visit   Patient Active Problem List   Diagnosis Date Noted   Esophageal obstruction due to food impaction    Chronic GERD    Aggressive outburst 10/09/2019   Attention deficit hyperactivity disorder (ADHD), combined type 04/05/2018   Pes planus of both feet 01/09/2015   Obesity 10/02/2012   Learning disability--FS IQ:  85 in 2011 10/02/2012   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 90 tablet 0   guanFACINE (INTUNIV) 2 MG TB24 ER tablet Take 1 tablet (2 mg total) by mouth at bedtime. 90 tablet 0   omeprazole (PRILOSEC) 40 MG capsule Take 1 capsule (40 mg total) by mouth 2 (two) times daily before a meal. (Patient not taking: Reported on 06/05/2021) 60 capsule 5   No current  facility-administered medications on file prior to visit.    No Known Allergies  Physical Exam:    Vitals:   06/05/21 1001  BP: 118/68  Pulse: (!) 107  Weight: 238 lb 8 oz (108.2 kg)  Height: 5\' 5"  (1.651 m)   Wt Readings from Last 3 Encounters:  06/05/21 238 lb 8 oz (108.2 kg)  03/29/21 230 lb (104.3 kg)  03/05/21 228 lb 9.6 oz (103.7 kg)     Growth percentile SmartLinks can only be used for patients less than 72 years old. No LMP for male patient.  Physical Exam Constitutional:      General: He is not in acute distress.    Appearance: He is well-developed.  Neck:     Thyroid: No thyromegaly.  Cardiovascular:     Rate and Rhythm: Normal rate and regular rhythm.     Heart sounds: No murmur heard. Pulmonary:     Effort: Pulmonary effort is normal.     Breath sounds: Normal breath sounds.     Comments: Dry cough throughout visit Lymphadenopathy:     Cervical: No cervical adenopathy.  Skin:    General: Skin is warm.     Findings: No rash.  Neurological:     Mental Status: He is alert.     Comments: No tremor     Assessment/Plan: -continues to be stable on Intuniv regimen of  1 mg in AM, 2 mg in PM  -return in 3 months or sooner if needed    1. Attention deficit hyperactivity disorder (ADHD), combined type 2. Autism - guanFACINE (INTUNIV) 2 MG TB24 ER tablet; Take 1 tablet (2 mg total) by mouth at bedtime.  Dispense: 90 tablet; Refill: 0 - guanFACINE (INTUNIV) 1 MG TB24 ER tablet; Take 1 tablet (1 mg total) by mouth every morning.  Dispense: 90 tablet; Refill: 0

## 2021-07-07 ENCOUNTER — Encounter: Payer: Self-pay | Admitting: Internal Medicine

## 2021-07-07 ENCOUNTER — Other Ambulatory Visit: Payer: Self-pay

## 2021-07-07 ENCOUNTER — Ambulatory Visit (HOSPITAL_BASED_OUTPATIENT_CLINIC_OR_DEPARTMENT_OTHER): Payer: Medicaid Other | Admitting: Anesthesiology

## 2021-07-07 ENCOUNTER — Encounter (HOSPITAL_COMMUNITY)
Admission: RE | Disposition: A | Discharge status: Home / Self Care | Payer: Self-pay | Source: Home / Self Care | Attending: Internal Medicine

## 2021-07-07 ENCOUNTER — Ambulatory Visit (HOSPITAL_COMMUNITY)
Admission: RE | Admit: 2021-07-07 | Discharge: 2021-07-07 | Disposition: A | Discharge status: Home / Self Care | Payer: Medicaid Other | Attending: Internal Medicine | Admitting: Internal Medicine

## 2021-07-07 ENCOUNTER — Ambulatory Visit (HOSPITAL_COMMUNITY): Payer: Medicaid Other | Admitting: Anesthesiology

## 2021-07-07 ENCOUNTER — Encounter (HOSPITAL_COMMUNITY): Payer: Self-pay

## 2021-07-07 DIAGNOSIS — F840 Autistic disorder: Secondary | ICD-10-CM | POA: Insufficient documentation

## 2021-07-07 DIAGNOSIS — F909 Attention-deficit hyperactivity disorder, unspecified type: Secondary | ICD-10-CM | POA: Insufficient documentation

## 2021-07-07 DIAGNOSIS — K200 Eosinophilic esophagitis: Secondary | ICD-10-CM

## 2021-07-07 DIAGNOSIS — K219 Gastro-esophageal reflux disease without esophagitis: Secondary | ICD-10-CM | POA: Insufficient documentation

## 2021-07-07 DIAGNOSIS — K222 Esophageal obstruction: Secondary | ICD-10-CM

## 2021-07-07 DIAGNOSIS — K449 Diaphragmatic hernia without obstruction or gangrene: Secondary | ICD-10-CM | POA: Diagnosis not present

## 2021-07-07 HISTORY — PX: ESOPHAGOGASTRODUODENOSCOPY (EGD) WITH PROPOFOL: SHX5813

## 2021-07-07 HISTORY — PX: BIOPSY: SHX5522

## 2021-07-07 SURGERY — ESOPHAGOGASTRODUODENOSCOPY (EGD) WITH PROPOFOL
Anesthesia: General

## 2021-07-07 MED ORDER — OMEPRAZOLE 40 MG PO CPDR: 40.0000 mg | DELAYED_RELEASE_CAPSULE | Freq: Two times a day (BID) | ORAL | 11 refills | Status: AC

## 2021-07-07 MED ORDER — LACTATED RINGERS IV SOLN: INTRAVENOUS | Status: DC

## 2021-07-07 MED ORDER — LIDOCAINE HCL (CARDIAC) PF 50 MG/5ML IV SOSY
PREFILLED_SYRINGE | INTRAVENOUS | Status: DC | PRN
  Administered 2021-07-07: 50 mg via INTRAVENOUS

## 2021-07-07 MED ORDER — PROPOFOL 10 MG/ML IV BOLUS
INTRAVENOUS | Status: DC | PRN
  Administered 2021-07-07: 100 mg via INTRAVENOUS
  Administered 2021-07-07: 150 mg via INTRAVENOUS

## 2021-07-07 NOTE — H&P (Signed)
?Primary Care Physician:  Jonetta Osgood, MD ?Primary Gastroenterologist:  Dr. Marletta Lor ? ?Pre-Procedure History & Physical: ?HPI:  Jacob [name] is a 21 y.o. male is here for an EGD with possible dilation due to history of Dysphagia, EOE, esophageal stricture, previous food impaction.  ? ?Past Medical History:  ?Diagnosis Date  ? Autism   ? Microdeletion syndrome   ? ? ?Past Surgical History:  ?Procedure Laterality Date  ? BIOPSY  03/29/2021  ? Procedure: BIOPSY;  Surgeon: Lanelle Bal, DO;  [facility];  Service: Endoscopy;;  esophageal  ? ESOPHAGOGASTRODUODENOSCOPY (EGD) WITH PROPOFOL N/A 03/29/2021  ? Procedure: ESOPHAGOGASTRODUODENOSCOPY (EGD) WITH PROPOFOL;  Surgeon: Lanelle Bal, DO;  Location: [facility];  Service: Endoscopy;  Laterality: N/A;  ? ? ?Prior to Admission medications   ?Medication Sig Start Date End Date Taking? Authorizing Provider  ?cetirizine (ZYRTEC) 10 MG tablet TAKE ONE TABLET BY MOUTH DAILY 03/21/20  Yes Ancil Linsey, MD  ?guanFACINE (INTUNIV) 1 MG TB24 ER tablet Take 1 tablet (1 mg total) by mouth every morning. 06/05/21  Yes Georges Mouse, NP  ?guanFACINE (INTUNIV) 2 MG TB24 ER tablet Take 1 tablet (2 mg total) by mouth at bedtime. 06/05/21  Yes Georges Mouse, NP  ?omeprazole (PRILOSEC) 40 MG capsule Take 1 capsule (40 mg total) by mouth 2 (two) times daily before a meal. 03/29/21 09/25/21 Yes Lanelle Bal, DO  ? ? ?Allergies as of 05/26/2021  ? (No Known Allergies)  ? ? ?History reviewed. No pertinent family history. ? ?Social History  ? ?Socioeconomic History  ? Marital status: Single  ?  Spouse name: Not on file  ? Number of children: Not on file  ? Years of education: Not on file  ? Highest education level: Not on file  ?Occupational History  ? Not on file  ?Tobacco Use  ? Smoking status: Never  ? Smokeless tobacco: Never  ? Tobacco comments:  ?  mom and dad outside   ?Vaping Use  ? Vaping Use: Never used  ?Substance and Sexual Activity  ? Alcohol  use: No  ?  Alcohol/week: 0.0 standard drinks  ? Drug use: No  ? Sexual activity: Never  ?Other Topics Concern  ? Not on file  ?Social History Narrative  ? Not on file  ? ?Social Determinants of Health  ? ?Financial Resource Strain: Not on file  ?Food Insecurity: Not on file  ?Transportation Needs: Not on file  ?Physical Activity: Not on file  ?Stress: Not on file  ?Social Connections: Not on file  ?Intimate Partner Violence: Not on file  ? ? ?Review of Systems: ?See HPI, otherwise negative ROS ? ?Physical Exam: ?Vital signs in last 24 hours: ?Temp:  [98.2 ?F (36.8 ?C)] 98.2 ?F (36.8 ?C) (03/21 0759) ?Resp:  [24] 24 (03/21 0759) ?BP: (147)/(78) 147/78 (03/21 0759) ?SpO2:  [98 %] 98 % (03/21 0759) ?Weight:  [102.1 kg] 102.1 kg (03/21 0759) ?  ?General:   Alert,  Well-developed, well-nourished, pleasant and cooperative in NAD ?Head:  Normocephalic and atraumatic. ?Eyes:  Sclera clear, no icterus.   Conjunctiva pink. ?Ears:  Normal auditory acuity. ?Nose:  No deformity, discharge,  or lesions. ?Mouth:  No deformity or lesions, dentition normal. ?Neck:  Supple; no masses or thyromegaly. ?Lungs:  Clear throughout to auscultation.   No wheezes, crackles, or rhonchi. No acute distress. ?Heart:  Regular rate and rhythm; no murmurs, clicks, rubs,  or gallops. ?Abdomen:  Soft, nontender and nondistended. No masses,  hepatosplenomegaly or hernias noted. Normal bowel sounds, without guarding, and without rebound.   ?Msk:  Symmetrical without gross deformities. Normal posture. ?Extremities:  Without clubbing or edema. ?Neurologic:  Alert and  oriented x4;  grossly normal neurologically. ?Skin:  Intact without significant lesions or rashes. ?Cervical Nodes:  No significant cervical adenopathy. ?Psych:  Alert and cooperative. Normal mood and affect. ? ?Impression/Plan: ?Jacob [name] is here for an EGD with possible dilation due to history of Dysphagia, EOE, esophageal stricture, previous food impaction.  ? ?The risks of the  procedure including infection, bleed, or perforation as well as benefits, limitations, alternatives and imponderables have been reviewed with the patient. Questions have been answered. All parties agreeable. ? ?

## 2021-07-07 NOTE — Anesthesia Postprocedure Evaluation (Signed)
Anesthesia Post Note ? ?Patient: Kaley Manjarres ? ?Procedure(s) Performed: ESOPHAGOGASTRODUODENOSCOPY (EGD) WITH PROPOFOL ?BIOPSY ? ?Patient location during evaluation: Phase II ?Anesthesia Type: General ?Level of consciousness: awake and alert and oriented ?Pain management: pain level controlled ?Vital Signs Assessment: post-procedure vital signs reviewed and stable ?Respiratory status: spontaneous breathing, nonlabored ventilation and respiratory function stable ?Cardiovascular status: blood pressure returned to baseline and stable ?Postop Assessment: no apparent nausea or vomiting ?Anesthetic complications: no ? ? ?No notable events documented. ? ? ?Last Vitals:  ?Vitals:  ? 07/07/21 1018 07/07/21 1024  ?BP: 125/61   ?Pulse: 94   ?Resp: 18   ?Temp:  36.8 ?C  ?SpO2: 95%   ?  ?Last Pain:  ?Vitals:  ? 07/07/21 1024  ?TempSrc: Oral  ?PainSc:   ? ? ?  ?  ?  ?  ?  ?  ? ?Ingris Pasquarella C Chiquitta Matty ? ? ? ? ?

## 2021-07-07 NOTE — Anesthesia Preprocedure Evaluation (Signed)
Anesthesia Evaluation  ?Patient identified by MRN, date of birth, ID band ?Patient awake ? ? ? ?Reviewed: ?Allergy & Precautions, NPO status , Patient's Chart, lab work & pertinent test results ? ?History of Anesthesia Complications ?Negative for: history of anesthetic complications ? ?Airway ?Mallampati: III ? ?TM Distance: >3 FB ?Neck ROM: Full ? ? ? Dental ? ?(+) Dental Advisory Given, Teeth Intact ?  ?Pulmonary ?neg pulmonary ROS,  ?  ?Pulmonary exam normal ?breath sounds clear to auscultation ? ? ? ? ? ? Cardiovascular ?Exercise Tolerance: Good ?Normal cardiovascular exam ?Rhythm:Regular Rate:Normal ? ?29-Mar-2021 14:00:10 [facility] ROUTINE RECORD ?December 31, 2000 (20 yr) ?Male Caucasian ?Vent. rate 93 BPM ?PR interval 148 ms ?QRS duration 88 ms ?QT/QTcB 329/410 ms ?P-R-T axes 38 37 52 ?Sinus arrhythmia ?Consider left atrial enlargement ?No significant change since prior 9/22 ?Confirmed by Meridee Score [phone]) on 03/29/2021 2:56:02 PM ?  ?Neuro/Psych ?PSYCHIATRIC DISORDERS (autism) negative neurological ROS ?   ? GI/Hepatic ?Neg liver ROS, GERD  Medicated,  ?Endo/Other  ?negative endocrine ROS ? Renal/GU ?negative Renal ROS  ? ?  ?Musculoskeletal ?negative musculoskeletal ROS ?(+)  ? Abdominal ?  ?Peds ? ?(+) ADHD Hematology ?negative hematology ROS ?(+)   ?Anesthesia Other Findings ? ? Reproductive/Obstetrics ?negative OB ROS ? ?  ? ? ? ? ? ? ? ? ? ? ? ? ? ?  ?  ? ? ? ? ? ? ? ? ?Anesthesia Physical ? ?Anesthesia Plan ? ?ASA: 3 ? ?Anesthesia Plan: General  ? ?Post-op Pain Management: Minimal or no pain anticipated  ? ?Induction: Intravenous, Rapid sequence and Cricoid pressure planned ? ?PONV Risk Score and Plan:  ? ?Airway Management Planned: Nasal Cannula and Natural Airway ? ?Additional Equipment:  ? ?Intra-op Plan:  ? ?Post-operative Plan:  ? ?Informed Consent: I have reviewed the patients History and Physical, chart, labs and discussed the procedure  including the risks, benefits and alternatives for the proposed anesthesia with the patient or authorized representative who has indicated his/her understanding and acceptance.  ? ? ? ?Dental advisory given ? ?Plan Discussed with: Surgeon and CRNA ? ?Anesthesia Plan Comments:   ? ? ? ? ? ? ?Anesthesia Quick Evaluation ? ?

## 2021-07-07 NOTE — Transfer of Care (Signed)
Immediate Anesthesia Transfer of Care Note ? ?Patient: Jacob [name] ? ?Procedure(s) Performed: ESOPHAGOGASTRODUODENOSCOPY (EGD) WITH PROPOFOL ?BIOPSY ? ?Patient Location: Endoscopy Unit ? ?Anesthesia Type:General ? ?Level of Consciousness: awake and patient cooperative ? ?Airway & Oxygen Therapy: Patient Spontanous Breathing and Patient connected to nasal cannula oxygen ? ?Post-op Assessment: Report given to RN and Post -op Vital signs reviewed and stable ? ?Post vital signs: Reviewed and stable ? ?Last Vitals:  ?Vitals Value Taken Time  ?BP    ?Temp    ?Pulse    ?Resp    ?SpO2    ? ?SEE PACU FLOW SHEET FOR VITAL SIGNS ?Last Pain:  ?Vitals:  ? 07/07/21 1002  ?TempSrc:   ?PainSc: 0-No pain  ?   ? ?Patients Stated Pain Goal: 3 (07/07/21 0759) ? ?Complications: No notable events documented. ?

## 2021-07-07 NOTE — Anesthesia Procedure Notes (Signed)
Date/Time: 07/07/2021 10:05 AM ?Performed by: Franco Nones, CRNA ?Pre-anesthesia Checklist: Patient identified, Emergency Drugs available, Suction available, Timeout performed and Patient being monitored ?Patient Re-evaluated:Patient Re-evaluated prior to induction ?Oxygen Delivery Method: Nasal Cannula ? ? ? ? ?

## 2021-07-07 NOTE — Discharge Instructions (Addendum)
EGD ?Discharge instructions ?Please read the instructions outlined below and refer to this sheet in the next few weeks. These discharge instructions provide you with general information on caring for yourself after you leave the hospital. Your doctor may also give you specific instructions. While your treatment has been planned according to the most current medical practices available, unavoidable complications occasionally occur. If you have any problems or questions after discharge, please call your doctor. ?ACTIVITY ?You may resume your regular activity but move at a slower pace for the next 24 hours.  ?Take frequent rest periods for the next 24 hours.  ?Walking will help expel (get rid of) the air and reduce the bloated feeling in your abdomen.  ?No driving for 24 hours (because of the anesthesia (medicine) used during the test).  ?You may shower.  ?Do not sign any important legal documents or operate any machinery for 24 hours (because of the anesthesia used during the test).  ?NUTRITION ?Drink plenty of fluids.  ?You may resume your normal diet.  ?Begin with a light meal and progress to your normal diet.  ?Avoid alcoholic beverages for 24 hours or as instructed by your caregiver.  ?MEDICATIONS ?You may resume your normal medications unless your caregiver tells you otherwise.  ?WHAT YOU CAN EXPECT TODAY ?You may experience abdominal discomfort such as a feeling of fullness or ?gas? pains.  ?FOLLOW-UP ?Your doctor will discuss the results of your test with you.  ?SEEK IMMEDIATE MEDICAL ATTENTION IF ANY OF THE FOLLOWING OCCUR: ?Excessive nausea (feeling sick to your stomach) and/or vomiting.  ?Severe abdominal pain and distention (swelling).  ?Trouble swallowing.  ?Temperature over 101? F (37.8? C).  ?Rectal bleeding or vomiting of blood.  ? ? ?Your esophagus overall look much improved compared to prior endoscopy.  Continue on omeprazole twice daily.  I sent you in a years worth of refills today.  I did repeat  biopsies of this area as well.  We will call you with these results.  Follow-up with Dr. Abbey Chatters in 6 months. ? ?I hope you have a great rest of your week! ? ?Elon Alas. Abbey Chatters, D.O. ?Gastroenterology and Hepatology ?[facility] ? ? ? ?Dr. Ave Filter office will call you with your follow-up appointment. ? ?PATIENT INSTRUCTIONS ?POST-ANESTHESIA ? ?IMMEDIATELY FOLLOWING SURGERY:  Do not drive or operate machinery for the first twenty four hours after surgery.  Do not make any important decisions for twenty four hours after surgery or while taking narcotic pain medications or sedatives.  If you develop intractable nausea and vomiting or a severe headache please notify your doctor immediately. ? ?FOLLOW-UP:  Please make an appointment with your surgeon as instructed. You do not need to follow up with anesthesia unless specifically instructed to do so. ? ?WOUND CARE INSTRUCTIONS (if applicable):  Keep a dry clean dressing on the anesthesia/puncture wound site if there is drainage.  Once the wound has quit draining you may leave it open to air.  Generally you should leave the bandage intact for twenty four hours unless there is drainage.  If the epidural site drains for more than 36-48 hours please call the anesthesia department. ? ?QUESTIONS?:  Please feel free to call your physician or the hospital operator if you have any questions, and they will be happy to assist you.    ? ? ? ? ? ?

## 2021-07-07 NOTE — Op Note (Signed)
[facility] ?Patient Name: Jacob [name] ?Procedure Date: 07/07/2021 9:56 AM ?MRN: [id number] ?Date of Birth: 10-12-2000 ?Attending MD: Elon Alas. Abbey Chatters , DO ?CSN: [id number] ?Age: 21 ?Admit Type: Outpatient ?Procedure:                Upper GI endoscopy ?Indications:              Follow-up of eosinophilic esophagitis ?Providers:                Elon Alas. Abbey Chatters, DO, Tammy Vaught, RN, Minette Headland  ?                          Maurine Simmering, Technician ?Referring MD:              ?Medicines:                See the Anesthesia note for documentation of the  ?                          administered medications ?Complications:            No immediate complications. ?Estimated Blood Loss:     Estimated blood loss was minimal. ?Procedure:                Pre-Anesthesia Assessment: ?                          - The anesthesia plan was to use monitored  ?                          anesthesia care (MAC). ?                          After obtaining informed consent, the endoscope was  ?                          passed under direct vision. Throughout the  ?                          procedure, the patient's blood pressure, pulse, and  ?                          oxygen saturations were monitored continuously. The  ?                          GIF-H190 ([id number]) scope was introduced through the  ?                          mouth, and advanced to the second part of duodenum.  ?                          The upper GI endoscopy was accomplished without  ?                          difficulty. The patient tolerated the procedure  ?                          well. ?Scope  In: 10:08:15 AM ?Scope Out: 10:13:04 AM ?Total Procedure Duration: 0 hours 4 minutes 49 seconds  ?Findings: ?     Mucosal changes were found in the middle third of the esophagus and in  ?     the lower third of the esophagus. Esophageal findings were graded using  ?     the Eosinophilic Esophagitis Endoscopic Reference Score (EoE-EREFS) as:  ?     Edema Grade 0 Normal (distinct  vascular markings), Rings Grade 1 Mild  ?     (subtle circumferential ridges seen on esophageal distension), Exudates  ?     Grade 0 None (no white lesions seen), Furrows Grade 1 Present (vertical  ?     lines with or without visible depth) and Stricture none (no stricture  ?     found). Biopsies were taken with a cold forceps for histology. Overall  ?     looks improved compared to prior EGD. ?     A small hiatal hernia was present. ?     A non-obstructing Schatzki ring was found in the distal esophagus. As  ?     patient dysphagia has resolved, elected not to dilate today. ?     The entire examined stomach was normal. ?     The duodenal bulb, first portion of the duodenum and second portion of  ?     the duodenum were normal. ?Impression:               - Esophageal mucosal changes secondary to  ?                          eosinophilic esophagitis. Biopsied. ?                          - Small hiatal hernia. ?                          - Non-obstructing Schatzki ring. ?                          - Normal stomach. ?                          - Normal duodenal bulb, first portion of the  ?                          duodenum and second portion of the duodenum. ?Moderate Sedation: ?     Per Anesthesia Care ?Recommendation:           - Patient has a contact number available for  ?                          emergencies. The signs and symptoms of potential  ?                          delayed complications were discussed with the  ?                          patient. Return to normal activities tomorrow.  ?  Written discharge instructions were provided to the  ?                          patient. ?                          - Resume previous diet. ?                          - Continue present medications. ?                          - Await pathology results. ?                          - Use Prilosec (omeprazole) 40 mg PO BID. Overall  ?                          looks much improved today. Symptoms have  resolved. ?                          - Return to GI clinic in 6 months. ?Procedure Code(s):        --- Professional --- ?                          [phone], Esophagogastroduodenoscopy, flexible,  ?                          transoral; with biopsy, single or multiple ?Diagnosis Code(s):        --- Professional --- ?                          E10.0, Eosinophilic esophagitis ?                          K44.9, Diaphragmatic hernia without obstruction or  ?                          gangrene ?                          K22.2, Esophageal obstruction ?CPT copyright 2019 American Medical Association. All rights reserved. ?The codes documented in this report are preliminary and upon coder review may  ?be revised to meet current compliance requirements. ?Elon Alas. Abbey Chatters, DO ?Elon Alas. Abbey Chatters, DO ?07/07/2021 10:19:48 AM ?This report has been signed electronically. ?Number of Addenda: 0 ?

## 2021-07-08 LAB — SURGICAL PATHOLOGY

## 2021-07-09 ENCOUNTER — Encounter (HOSPITAL_COMMUNITY): Payer: Self-pay | Admitting: Internal Medicine

## 2021-08-03 ENCOUNTER — Ambulatory Visit: Payer: Medicaid Other | Admitting: Gastroenterology

## 2021-08-29 ENCOUNTER — Other Ambulatory Visit: Payer: Self-pay | Admitting: Family

## 2021-08-29 DIAGNOSIS — F902 Attention-deficit hyperactivity disorder, combined type: Secondary | ICD-10-CM

## 2021-08-29 DIAGNOSIS — F840 Autistic disorder: Secondary | ICD-10-CM

## 2021-09-01 ENCOUNTER — Ambulatory Visit (INDEPENDENT_AMBULATORY_CARE_PROVIDER_SITE_OTHER): Payer: Medicaid Other | Admitting: Family

## 2021-09-01 ENCOUNTER — Encounter: Payer: Self-pay | Admitting: Family

## 2021-09-01 VITALS — BP 136/77 | HR 94 | Ht 65.16 in | Wt 243.8 lb

## 2021-09-01 DIAGNOSIS — F902 Attention-deficit hyperactivity disorder, combined type: Secondary | ICD-10-CM

## 2021-09-01 DIAGNOSIS — R030 Elevated blood-pressure reading, without diagnosis of hypertension: Secondary | ICD-10-CM

## 2021-09-01 DIAGNOSIS — R0989 Other specified symptoms and signs involving the circulatory and respiratory systems: Secondary | ICD-10-CM

## 2021-09-01 DIAGNOSIS — E559 Vitamin D deficiency, unspecified: Secondary | ICD-10-CM

## 2021-09-01 DIAGNOSIS — F840 Autistic disorder: Secondary | ICD-10-CM | POA: Diagnosis not present

## 2021-09-01 NOTE — Progress Notes (Signed)
History was provided by the patient. ? ?Jacob [name] is a 21 y.o. male who is here for ADHD, ASD.  ? ?PCP confirmed? Yes.   ? Jonetta Osgood, MD ? ?Plan from last visit:  ?Assessment/Plan: ?-continues to be stable on Intuniv regimen of 1 mg in AM, 2 mg in PM  ?-return in 3 months or sooner if needed   ?  ?1. Attention deficit hyperactivity disorder (ADHD), combined type ?2. Autism ?- guanFACINE (INTUNIV) 2 MG TB24 ER tablet; Take 1 tablet (2 mg total) by mouth at bedtime.  Dispense: 90 tablet; Refill: 0 ?- guanFACINE (INTUNIV) 1 MG TB24 ER tablet; Take 1 tablet (1 mg total) by mouth every morning.  Dispense: 90 tablet; Refill: 0 ?  ? ? ? 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 Eager to get to the appointment today  ? ?-no headaches, no wheezing or chest tightness  ?-coughing/clearing throat  ?-taking medicine  ? ?Patient Active Problem List  ? Diagnosis Date Noted  ? Esophageal obstruction due to food impaction   ? Chronic GERD   ? Aggressive outburst 10/09/2019  ? Attention deficit hyperactivity disorder (ADHD), combined type 04/05/2018  ? Pes planus of both feet 01/09/2015  ? Obesity 10/02/2012  ? Learning disability--FS IQ:  30 in 2011 10/02/2012  ? 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 Language disorder involving understanding and expression of language 10/02/2012  ? Tic disorder 10/02/2012  ? Autism   ? ? ?Current Outpatient Medications on File  Prior to Visit  ?Medication Sig Dispense Refill  ? cetirizine (ZYRTEC) 10 MG tablet TAKE ONE TABLET BY MOUTH DAILY 30 tablet 9  ? guanFACINE (INTUNIV) 1 MG TB24 ER tablet TAKE ONE TABLET BY MOUTH EVERY MORNING 90 tablet 0  ? guanFACINE (INTUNIV) 2 MG TB24 ER tablet TAKE ONE TABLET BY MOUTH AT BEDTIME 90 tablet 0  ? omeprazole (PRILOSEC) 40 MG capsule Take 1 capsule (40 mg total) by mouth 2 (two) times daily before a meal. 60 capsule 11  ? ?No current facility-administered medications on file prior to visit.  ? ? ?No Known Allergies ? ?Physical Exam:  ?  ?Vitals:  ? 09/01/21 0950 09/01/21 1035  ?BP: (!) 141/83 136/77  ?Pulse: (!) 107 94  ?Weight: 243 lb 12.8 oz (110.6 kg)   ?Height: 5' 5.16" (1.655 m)   ? ? ?Growth percentile SmartLinks can only be used for patients less than 73 years old. ?No LMP for male patient. ? ?Physical Exam ?Constitutional:   ?   General: He is not in acute distress. ?   Appearance: He is well-developed.  ?HENT:  ?   Mouth/Throat:  ?   Pharynx: Posterior oropharyngeal erythema present.  ?   Comments: Throat clearing ?Neck:  ?   Thyroid: No thyromegaly.  ?Cardiovascular:  ?   Rate and Rhythm: Normal rate and regular rhythm.  ?   Heart sounds: No murmur  heard. ?Pulmonary:  ?   Effort: Pulmonary effort is normal.  ?   Breath sounds: Normal breath sounds. No wheezing.  ?Abdominal:  ?   Palpations: Abdomen is soft. There is no mass.  ?   Tenderness: There is no abdominal tenderness. There is no guarding.  ?Musculoskeletal:  ?   Cervical back: Normal range of motion.  ?Lymphadenopathy:  ?   Cervical: No cervical adenopathy.  ?Skin: ?   General: Skin is warm.  ?   Findings: No rash.  ?Neurological:  ?   Mental Status: He is alert.  ?   Comments: No tremor  ?  ? ?Assessment/Plan: ? ?-continue with Intuniv 2 mg at bedtime, 1 mg in morning  ?-will assess thyroid panel today, as well as CMP, CBC ?-repeat BP was normal, likely related to upset re: work situation  ?-return in 3 months or sooner if  needed ? ?1. Attention deficit hyperactivity disorder (ADHD), combined type ?2. Autism ?3. Elevated blood pressure reading ?- Comprehensive metabolic panel ?- CBC ? ?4. Vitamin D deficiency ?- VITAMIN D 25 Hydroxy (Vit-D Deficiency, Fractures) ? ?5. Throat clearing ?- Thyroid Panel With TSH ? ? ?

## 2021-09-02 LAB — CBC
HCT: 53.2 % — ABNORMAL HIGH (ref 38.5–50.0)
Hemoglobin: 18.1 g/dL — ABNORMAL HIGH (ref 13.2–17.1)
MCH: 27.5 pg (ref 27.0–33.0)
MCHC: 34 g/dL (ref 32.0–36.0)
MCV: 80.7 fL (ref 80.0–100.0)
MPV: 9.3 fL (ref 7.5–12.5)
Platelets: 279 Thousand/uL (ref 140–400)
RBC: 6.59 Million/uL — ABNORMAL HIGH (ref 4.20–5.80)
RDW: 13 % (ref 11.0–15.0)
WBC: 6.1 Thousand/uL (ref 3.8–10.8)

## 2021-09-02 LAB — COMPREHENSIVE METABOLIC PANEL
AG Ratio: 1.5 (calc) (ref 1.0–2.5)
ALT: 44 U/L (ref 9–46)
AST: 23 U/L (ref 10–40)
Albumin: 4.4 g/dL (ref 3.6–5.1)
Alkaline phosphatase (APISO): 98 U/L (ref 36–130)
BUN: 12 mg/dL (ref 7–25)
CO2: 26 mmol/L (ref 20–32)
Calcium: 9.8 mg/dL (ref 8.6–10.3)
Chloride: 104 mmol/L (ref 98–110)
Creat: 0.8 mg/dL (ref 0.60–1.24)
Globulin: 3 g/dL (calc) (ref 1.9–3.7)
Glucose, Bld: 92 mg/dL (ref 65–139)
Potassium: 4.3 mmol/L (ref 3.5–5.3)
Sodium: 139 mmol/L (ref 135–146)
Total Bilirubin: 0.5 mg/dL (ref 0.2–1.2)
Total Protein: 7.4 g/dL (ref 6.1–8.1)

## 2021-09-02 LAB — THYROID PANEL WITH TSH
Free Thyroxine Index: 2.4 (ref 1.4–3.8)
T3 Uptake: 30 % (ref 22–35)
T4, Total: 8 ug/dL (ref 5.1–10.3)
TSH: 3.84 m[IU]/L (ref 0.40–4.50)

## 2021-09-02 LAB — VITAMIN D 25 HYDROXY (VIT D DEFICIENCY, FRACTURES): Vit D, 25-Hydroxy: 30 ng/mL (ref 30–100)

## 2021-12-03 ENCOUNTER — Encounter: Payer: Self-pay | Admitting: Family

## 2021-12-03 ENCOUNTER — Ambulatory Visit (INDEPENDENT_AMBULATORY_CARE_PROVIDER_SITE_OTHER): Payer: Medicaid Other | Admitting: Family

## 2021-12-03 VITALS — BP 133/77 | HR 105 | Ht 64.5 in | Wt 236.6 lb

## 2021-12-03 DIAGNOSIS — F840 Autistic disorder: Secondary | ICD-10-CM

## 2021-12-03 DIAGNOSIS — F902 Attention-deficit hyperactivity disorder, combined type: Secondary | ICD-10-CM

## 2021-12-03 MED ORDER — GUANFACINE HCL ER 2 MG PO TB24: 2.0000 mg | ORAL_TABLET | Freq: Every day | ORAL | 0 refills | Status: DC

## 2021-12-03 MED ORDER — GUANFACINE HCL ER 1 MG PO TB24: 1.0000 mg | ORAL_TABLET | Freq: Every morning | ORAL | 0 refills | Status: DC

## 2021-12-03 NOTE — Progress Notes (Signed)
History was provided by the patient.  Jacob [name] is a 21 y.o. male who is here for ADHD, ASD.   PCP confirmed? Yes.    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; closes Labor Day  -trying to keep him on in the winter year-long -taking medications as prescribed; no questions or concerns  -no headaches, no chest pain, no SOB, no stomach pain   -mood good  -sleep and appetite is good   Patient Active Problem List   Diagnosis Date Noted   Esophageal obstruction due to food impaction    Chronic GERD    Aggressive outburst 10/09/2019   Attention deficit hyperactivity disorder (ADHD), combined type 04/05/2018   Pes planus of both feet 01/09/2015   Obesity 10/02/2012   Learning disability--FS IQ:  85 in 2011 10/02/2012   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 10/02/2012   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 60 capsule 11   No current facility-administered medications on file prior to visit.    No Known Allergies  Physical Exam:    Vitals:   12/03/21 1321  BP: 133/77  Pulse: (!) 105  Weight: 236 lb 9.6 oz (107.3 kg)  Height: 5' 4.5" (1.638 m)   Wt Readings from Last 3 Encounters:  12/03/21 236 lb 9.6 oz (107.3 kg)  09/01/21 243 lb 12.8 oz (110.6 kg)  07/07/21 225 lb (102.1 kg)     Growth %ile SmartLinks can only be used for patients less than 51 years old. No LMP for male  patient.  Physical Exam Constitutional:      General: He is not in acute distress.    Appearance: He is well-developed.  Neck:     Thyroid: No thyromegaly.  Cardiovascular:     Rate and Rhythm: Normal rate and regular rhythm.     Heart sounds: No murmur heard. Pulmonary:     Breath sounds: Normal breath sounds.  Lymphadenopathy:     Cervical: No cervical adenopathy.  Skin:    General: Skin is warm.     Findings: No rash.  Neurological:     Mental Status: He is alert.     Comments: No tremor  Psychiatric:        Mood and Affect: Mood normal.        12/07/2021   10:42 AM 03/05/2021   10:35 AM 11/05/2019    1:55 PM  PHQ-SADS Last 3 Score only  PHQ-15 Score 0 0 0  Total GAD-7 Score 0 0 0  PHQ Adolescent Score 0 0 0    Assessment/Plan:  -doing well, stable on regimen  -no changes indicated today  -discussed that I will continue to prescribe medications until 21 yo -will work on referrals for PCP and ongoing medication management to ensure he has a good fit for ongoing care  -return in 3 months or sooner if  needed  1. Attention deficit hyperactivity disorder (ADHD), combined type 2. Autism - guanFACINE (INTUNIV) 2 MG TB24 ER tablet; Take 1 tablet (2 mg total) by mouth at bedtime.  Dispense: 90 tablet; Refill: 0 - guanFACINE (INTUNIV) 1 MG TB24 ER tablet; Take 1 tablet (1 mg total) by mouth every morning.  Dispense: 90 tablet; Refill: 0

## 2021-12-07 ENCOUNTER — Encounter: Payer: Self-pay | Admitting: Family

## 2022-01-04 ENCOUNTER — Encounter: Payer: Self-pay | Admitting: *Deleted

## 2022-03-08 ENCOUNTER — Encounter: Payer: Self-pay | Admitting: *Deleted

## 2022-03-08 ENCOUNTER — Ambulatory Visit (INDEPENDENT_AMBULATORY_CARE_PROVIDER_SITE_OTHER): Payer: Medicaid Other | Admitting: Family

## 2022-03-08 ENCOUNTER — Encounter: Payer: Self-pay | Admitting: Family

## 2022-03-08 VITALS — BP 140/76 | HR 92 | Ht 65.0 in | Wt 248.8 lb

## 2022-03-08 DIAGNOSIS — Z6841 Body Mass Index (BMI) 40.0 and over, adult: Secondary | ICD-10-CM | POA: Diagnosis not present

## 2022-03-08 DIAGNOSIS — F840 Autistic disorder: Secondary | ICD-10-CM | POA: Diagnosis not present

## 2022-03-08 DIAGNOSIS — F902 Attention-deficit hyperactivity disorder, combined type: Secondary | ICD-10-CM | POA: Diagnosis not present

## 2022-03-08 MED ORDER — GUANFACINE HCL ER 1 MG PO TB24: 1.0000 mg | ORAL_TABLET | Freq: Every morning | ORAL | 0 refills | Status: DC

## 2022-03-08 MED ORDER — GUANFACINE HCL ER 2 MG PO TB24: 2.0000 mg | ORAL_TABLET | Freq: Every day | ORAL | 0 refills | Status: DC

## 2022-03-08 NOTE — Patient Instructions (Addendum)
It may be helpful to use Vaseline after showers to areas of rough skin - especially on the back of your neck. Look for the blue tub of Vaseline or store brand:

## 2022-03-08 NOTE — Progress Notes (Unsigned)
History was provided by the patient.  Jacob [name] is a 21 y.o. male who is here for ADHD follow-up in context of ASD.   Plan at previous visit: -doing well, stable on regimen  -no changes indicated today  -discussed that I will continue to prescribe medications until 21 yo -will work on referrals for PCP and ongoing medication management to ensure he has a good fit for ongoing care  -return in 3 months or sooner if needed  1. Attention deficit hyperactivity disorder (ADHD), combined type 2. Autism - guanFACINE (INTUNIV) 2 MG TB24 ER tablet; Take 1 tablet (2 mg total) by mouth at bedtime.  Dispense: 90 tablet; Refill: 0 - guanFACINE (INTUNIV) 1 MG TB24 ER tablet; Take 1 tablet (1 mg total) by mouth every morning.  Dispense: 90 tablet; Refill: 0  PCP confirmed? Yes.    Jacob Osgood, MD  HPI:   [name] reports overall things have been going very well. No issues with his prescribed medications or side effects. 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 Reports frequent sunscreen use.  Reports 5-8 hours of sleep per night. When he has less sleep generally is when he has a busier day with his two jobs. Feels well rested during the day.   Denies recent alcohol or tobacco use. Denies illicit substance use. Denies recent sexual activity. Denies SI/HI.      03/08/2022   12:03 PM 03/08/2022   11:13 AM 12/07/2021   10:42 AM  PHQ-SADS Last 3 Score only  PHQ-15 Score 0  0  Total GAD-7 Score 0  0  PHQ Adolescent Score 0 0 0     Review of Systems  Constitutional:  Negative for malaise/fatigue and weight loss.  HENT:  Negative for hearing loss.   Respiratory:  Negative for shortness of breath.   Cardiovascular:  Negative for chest pain and palpitations.  Gastrointestinal:  Negative for constipation, heartburn and nausea.  Skin:  Negative for itching and rash.  Neurological:  Negative for tremors, weakness and headaches.   Psychiatric/Behavioral:  Negative for depression, substance abuse and suicidal ideas. The patient does not have insomnia.      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 90 tablet 0   guanFACINE (INTUNIV) 2 MG TB24 ER tablet Take 1 tablet (2 mg total) by mouth at bedtime. 90 tablet 0   omeprazole (PRILOSEC) 40 MG capsule Take 1 capsule (40 mg total) by mouth 2 (two) times daily before a meal. (Patient not taking: Reported on 03/08/2022) 60 capsule 11   No current facility-administered medications on file prior to visit.    No Known Allergies  Physical Exam:    Vitals:   03/08/22 1046 03/08/22 1051  BP: (!) 150/86 (!) 140/76  Pulse: 88 92  Weight: 248 lb 12.8 oz (112.9 kg)   Height: 5\' 5"  (1.651 m)     Growth %ile SmartLinks can only be used for patients less than 60 years old. No LMP for male patient.  Physical Exam Constitutional:      Appearance: He is obese.  HENT:     Head: Normocephalic and atraumatic.     Nose: Nose normal. No congestion.     Mouth/Throat:     Mouth: Mucous membranes are moist.     Pharynx: Oropharynx is clear.  Eyes:     Conjunctiva/sclera: Conjunctivae normal.     Pupils: Pupils are equal, round,  and reactive to light.  Cardiovascular:     Rate and Rhythm: Normal rate and regular rhythm.     Pulses: Normal pulses.     Heart sounds: Normal heart sounds.  Pulmonary:     Effort: Pulmonary effort is normal. No respiratory distress.     Breath sounds: Normal breath sounds. No wheezing.  Abdominal:     General: Abdomen is flat. There is no distension.     Palpations: Abdomen is soft.     Tenderness: There is no abdominal tenderness.  Musculoskeletal:        General: No swelling or signs of injury.  Skin:    General: Skin is warm and dry.     Capillary Refill: Capillary refill takes less than 2 seconds.     Comments: Mild thickening of skin with cracking of posterior base of neck, no hyperpigmentation noted  Neurological:     General: No focal deficit present.     Mental Status: He is alert and oriented to person, place, and time.     Cranial Nerves: No cranial nerve deficit.     Motor: No weakness.  Psychiatric:        Mood and Affect: Mood normal.        Behavior: Behavior normal.        Thought Content: Thought content normal.      Assessment/Plan: -labs to assess A1C, lipids  -doing well, stable on regimen  -no changes indicated today  -discussed that I will continue to prescribe medications until 21 yo -will work on referrals for PCP and ongoing medication management to ensure he has a good fit for ongoing care  -return in 3 months or sooner if needed  1. Attention deficit hyperactivity disorder (ADHD), combined type 2. Autism - guanFACINE (INTUNIV) 2 MG TB24 ER tablet; Take 1 tablet (2 mg total) by mouth at bedtime.  Dispense: 90 tablet; Refill: 0 - guanFACINE (INTUNIV) 1 MG TB24 ER tablet; Take 1 tablet (1 mg total) by mouth every morning.  Dispense: 90 tablet; Refill: 0  3. Obesity - Hgb A1C, lipid profile today  --- 21, MD PGY-3, [facility] 03/08/22  Supervising Provider Co-Signature  I reviewed with the resident the medical history and the  resident's findings on physical examination.  I discussed with the resident the patient's diagnosis and concur with the treatment plan as documented in the resident's note.  03/10/22, NP

## 2022-03-09 ENCOUNTER — Encounter: Payer: Self-pay | Admitting: Family

## 2022-03-09 LAB — LIPID PANEL
Cholesterol: 203 mg/dL — ABNORMAL HIGH (ref <200)
HDL: 46 mg/dL (ref >=40)
LDL Cholesterol (Calc): 130 mg/dL (calc) — ABNORMAL HIGH
Non-HDL Cholesterol (Calc): 157 mg/dL (calc) — ABNORMAL HIGH (ref <130)
Total CHOL/HDL Ratio: 4.4 (calc) (ref <5.0)
Triglycerides: 153 mg/dL — ABNORMAL HIGH (ref <150)

## 2022-03-09 LAB — HEMOGLOBIN A1C
Hgb A1c MFr Bld: 5.1 % of total Hgb (ref <5.7)
Mean Plasma Glucose: 100 mg/dL
eAG (mmol/L): 5.5 mmol/L

## 2022-05-24 ENCOUNTER — Ambulatory Visit: Payer: Medicaid Other | Admitting: Family

## 2022-05-24 ENCOUNTER — Telehealth: Payer: Self-pay

## 2022-05-24 NOTE — Telephone Encounter (Signed)
LVM for patient to reschedule missed med follow up with [name], FNP today. If patient calls back, Alyse Low can see him virtually on Wednesday or Thursday this week.   Will likely need assistance with transition to adult care for both PCP/psychiatry.

## 2022-05-25 ENCOUNTER — Ambulatory Visit (INDEPENDENT_AMBULATORY_CARE_PROVIDER_SITE_OTHER): Payer: Medicaid Other | Admitting: Family

## 2022-05-25 ENCOUNTER — Encounter: Payer: Self-pay | Admitting: Family

## 2022-05-25 VITALS — BP 131/77 | HR 96 | Ht 65.0 in | Wt 257.4 lb

## 2022-05-25 DIAGNOSIS — F840 Autistic disorder: Secondary | ICD-10-CM

## 2022-05-25 DIAGNOSIS — F902 Attention-deficit hyperactivity disorder, combined type: Secondary | ICD-10-CM | POA: Diagnosis not present

## 2022-05-25 NOTE — Progress Notes (Signed)
History was provided by the patient.  Jacob [name] is a 22 y.o. male who is here for ADHD, combined type, ASD.   PCP confirmed? Yes.    Dillon Bjork, MD  HPI:   -doing well, working a lot  -taking meds as prescribed  -mood is good, no concerns  -appetite and sleep good  -no issues with food getting caught, stuck; no wheezing or choking episodes  ADHD Medication Side Effects: Sleep problems: no Loss of appetite: no Abdominal pain: no Headache: no Irritability: no Dizziness: no Heart Palpitations: no Tics: no   Patient Active Problem List   Diagnosis Date Noted   Esophageal obstruction due to food impaction    Chronic GERD    Aggressive outburst 10/09/2019   Attention deficit hyperactivity disorder (ADHD), combined type 04/05/2018   Pes planus of both feet 01/09/2015   Obesity 10/02/2012   Learning disability--FS IQ:  18 in 2011 10/02/2012   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 90 tablet 0   guanFACINE (INTUNIV) 2 MG TB24 ER tablet Take 1 tablet (2 mg total) by mouth at bedtime. 90 tablet 0   omeprazole (PRILOSEC) 40 MG capsule Take 1 capsule (40 mg total) by mouth 2 (two) times daily before a meal. (Patient not taking: Reported on 03/08/2022) 60 capsule 11   No current facility-administered medications on file prior to visit.    No Known Allergies  Physical Exam:    Vitals:   05/25/22 1629  BP: 131/77  Pulse: 96  Weight: 257 lb 6.4 oz (116.8 kg)  Height: 5' 5"$  (1.651 m)    Growth %ile SmartLinks can only be used for patients less than  48 years old. No LMP for male patient.  Physical Exam Constitutional:      General: He is not in acute distress.    Appearance: He is well-developed.  Neck:     Thyroid: No thyromegaly.  Cardiovascular:     Rate and Rhythm: Normal rate and regular rhythm.     Heart sounds: No murmur heard. Pulmonary:     Breath sounds: Normal breath sounds.  Lymphadenopathy:     Cervical: No cervical adenopathy.  Skin:    General: Skin is warm.     Findings: No rash.  Neurological:     Mental Status: He is alert.     Motor: No tremor.     Comments: No tremor  Psychiatric:     Comments: Conversant, at baseline mild stutter noted       Assessment/Plan:  1. Attention deficit hyperactivity disorder (ADHD), combined type 2. Autism -continue with Intuniv 2 mg at bedtime, 1 mg in AM  -referral for adult care - chart to Rosemarie Beath to assist due to aging out this year

## 2022-05-29 ENCOUNTER — Encounter: Payer: Self-pay | Admitting: Family

## 2022-06-04 ENCOUNTER — Ambulatory Visit: Payer: Medicaid Other

## 2022-06-04 DIAGNOSIS — Z09 Encounter for follow-up examination after completed treatment for conditions other than malignant neoplasm: Secondary | ICD-10-CM

## 2022-06-04 NOTE — Progress Notes (Unsigned)
CASE MANAGEMENT VISIT  Total time: 20 minutes  Reason for referral Jacob [name] was referred by [name], FNP for connection to adult primary care due to aging out this year.   Summary of Today's Visit: Connected with Jacob [name] via phone call this morning. Discussed transition to adult primary care due to age. Jacob [name] has no preference for male/male provider. He would like an appointment on a Wednesday, Thursday or Friday.  [name] spoke with staff at [facility] and scheduled for May 8 at 3:00pm. [name] spoke with Jacob [name] and provided appointment day and time, address and phone number to primary care [place].   Plan for Next Visit:     [name]

## 2022-08-05 ENCOUNTER — Other Ambulatory Visit: Payer: Self-pay | Admitting: Internal Medicine

## 2022-08-06 ENCOUNTER — Ambulatory Visit: Payer: Medicaid Other | Admitting: Family Medicine

## 2022-08-24 ENCOUNTER — Ambulatory Visit: Payer: Medicaid Other | Admitting: Family

## 2022-08-25 ENCOUNTER — Ambulatory Visit (INDEPENDENT_AMBULATORY_CARE_PROVIDER_SITE_OTHER): Payer: Medicaid Other | Admitting: Family Medicine

## 2022-08-25 ENCOUNTER — Encounter: Payer: Self-pay | Admitting: Family Medicine

## 2022-08-25 VITALS — BP 124/79 | HR 97 | Temp 98.1°F | Resp 16 | Ht 68.0 in | Wt 110.0 lb

## 2022-08-25 DIAGNOSIS — F959 Tic disorder, unspecified: Secondary | ICD-10-CM | POA: Diagnosis not present

## 2022-08-25 DIAGNOSIS — K219 Gastro-esophageal reflux disease without esophagitis: Secondary | ICD-10-CM

## 2022-08-25 DIAGNOSIS — Z7689 Persons encountering health services in other specified circumstances: Secondary | ICD-10-CM | POA: Diagnosis not present

## 2022-08-25 DIAGNOSIS — F902 Attention-deficit hyperactivity disorder, combined type: Secondary | ICD-10-CM

## 2022-08-25 NOTE — Progress Notes (Signed)
Patient is here to established care with provider today. Patient has many health concern they would like to discuss with provider today  Care gaps discuss at appointment today  

## 2022-08-25 NOTE — Progress Notes (Signed)
New Patient Office Visit  Subjective    Patient ID: Jacob [name], male    DOB: 01-Jul-2000  Age: 22 y.o. MRN: [id number]  CC:  Chief Complaint  Patient presents with   Establish Care    HPI Jacob [name] presents to establish care and for review of chronic med issues. Patient denies acute complaints.    Outpatient Encounter Medications as of 08/25/2022  Medication Sig   melatonin 1 MG TABS tablet Take by mouth.   cetirizine (ZYRTEC) 10 MG tablet TAKE ONE TABLET BY MOUTH DAILY   guanFACINE (INTUNIV) 1 MG TB24 ER tablet Take 1 tablet (1 mg total) by mouth every morning.   guanFACINE (INTUNIV) 2 MG TB24 ER tablet Take 1 tablet (2 mg total) by mouth at bedtime.   omeprazole (PRILOSEC) 40 MG capsule Take 1 capsule (40 mg total) by mouth 2 (two) times daily before a meal. (Patient not taking: Reported on 03/08/2022)   No facility-administered encounter medications on file as of 08/25/2022.    Past Medical History:  Diagnosis Date   Autism    Microdeletion syndrome     Past Surgical History:  Procedure Laterality Date   BIOPSY  03/29/2021   Procedure: BIOPSY;  Surgeon: Lanelle Bal, DO;  [facility];  Service: Endoscopy;;  esophageal   BIOPSY  07/07/2021   Procedure: BIOPSY;  Surgeon: Lanelle Bal, DO;  [facility];  Service: Endoscopy;;   ESOPHAGOGASTRODUODENOSCOPY (EGD) WITH PROPOFOL N/A 03/29/2021   Procedure: ESOPHAGOGASTRODUODENOSCOPY (EGD) WITH PROPOFOL;  Surgeon: Lanelle Bal, DO;  [facility];  Service: Endoscopy;  Laterality: N/A;   ESOPHAGOGASTRODUODENOSCOPY (EGD) WITH PROPOFOL N/A 07/07/2021   Procedure: ESOPHAGOGASTRODUODENOSCOPY (EGD) WITH PROPOFOL;  Surgeon: Lanelle Bal, DO;  [facility];  Service: Endoscopy;  Laterality: N/A;  2:00pm    History reviewed. No pertinent family history.  Social History   Socioeconomic History   Marital status: Single    Spouse name: Not on file   Number of  children: Not on file   Years of education: Not on file   Highest education level: Not on file  Occupational History   Not on file  Tobacco Use   Smoking status: Never   Smokeless tobacco: Never   Tobacco comments:    mom and dad outside   Vaping Use   Vaping Use: Never used  Substance and Sexual Activity   Alcohol use: No    Alcohol/week: 0.0 standard drinks of alcohol   Drug use: No   Sexual activity: Never  Other Topics Concern   Not on file  Social History Narrative   Not on file   Social Determinants of Health   Financial Resource Strain: Not on file  Food Insecurity: Not on file  Transportation Needs: Not on file  Physical Activity: Not on file  Stress: Not on file  Social Connections: Not on file  Intimate Partner Violence: Not on file    Review of Systems  All other systems reviewed and are negative.       Objective    BP 124/79   Pulse 97   Temp 98.1 F (36.7 C) (Oral)   Resp 16   Ht 5\' 8"  (1.727 m)   Wt 110 lb (49.9 kg)   SpO2 97%   BMI 16.73 kg/m   Physical Exam Vitals and nursing note reviewed.  Constitutional:      General: He is not in acute distress. Cardiovascular:  Rate and Rhythm: Normal rate and regular rhythm.  Pulmonary:     Effort: Pulmonary effort is normal.     Breath sounds: Normal breath sounds.  Abdominal:     Palpations: Abdomen is soft.     Tenderness: There is no abdominal tenderness.  Neurological:     General: No focal deficit present.     Mental Status: He is alert and oriented to person, place, and time.         Assessment & Plan:   1. Attention deficit hyperactivity disorder (ADHD), combined type Appears stable.   2. Tic disorder Appears stable.  3. Chronic GERD Discussed dietary and activity options. Continue   4. Encounter to establish care   No follow-ups on file.   Tommie Raymond, MD

## 2022-08-31 ENCOUNTER — Encounter: Payer: Self-pay | Admitting: Family Medicine

## 2022-09-07 ENCOUNTER — Telehealth: Payer: Self-pay | Admitting: *Deleted

## 2022-09-07 ENCOUNTER — Ambulatory Visit: Payer: Medicaid Other | Admitting: Family

## 2022-09-07 NOTE — Telephone Encounter (Signed)
Called patient to cancel today's appointment but no answer. Left a detailed message explaining that since he is being seen with adult clinic that he does not to be seen by [facility] today. Left number for patient to call the office back if he had any questions and or concerns.

## 2022-11-08 ENCOUNTER — Encounter: Payer: Self-pay | Admitting: Pediatrics

## 2023-03-02 ENCOUNTER — Ambulatory Visit: Payer: MEDICAID | Admitting: Family Medicine

## 2023-03-02 ENCOUNTER — Encounter: Payer: Self-pay | Admitting: Family Medicine

## 2023-03-02 VITALS — BP 139/77 | HR 103 | Temp 98.0°F | Resp 18 | Ht 66.0 in | Wt 256.0 lb

## 2023-03-02 DIAGNOSIS — Z23 Encounter for immunization: Secondary | ICD-10-CM | POA: Diagnosis not present

## 2023-03-02 DIAGNOSIS — F840 Autistic disorder: Secondary | ICD-10-CM

## 2023-03-02 DIAGNOSIS — F902 Attention-deficit hyperactivity disorder, combined type: Secondary | ICD-10-CM | POA: Diagnosis not present

## 2023-03-02 MED ORDER — GUANFACINE HCL ER 2 MG PO TB24: 2.0000 mg | ORAL_TABLET | Freq: Every day | ORAL | 1 refills | Status: DC

## 2023-03-02 MED ORDER — GUANFACINE HCL ER 1 MG PO TB24: 1.0000 mg | ORAL_TABLET | Freq: Every morning | ORAL | 1 refills | Status: DC

## 2023-03-08 ENCOUNTER — Encounter: Payer: Self-pay | Admitting: Family Medicine

## 2023-03-08 NOTE — Progress Notes (Signed)
Established Patient Office Visit  Subjective    Patient ID: Jacob [name], male    DOB: 2001-01-27  Age: 22 y.o. MRN: [id number]  CC:  Chief Complaint  Patient presents with   Follow-up    6 month    HPI Jacob [name] presents for follow up of ADHD. Patient reports doing well but also believes that he is autistic as well.   Outpatient Encounter Medications as of 03/02/2023  Medication Sig   cetirizine (ZYRTEC) 10 MG tablet TAKE ONE TABLET BY MOUTH DAILY   melatonin 1 MG TABS tablet Take by mouth.   [DISCONTINUED] guanFACINE (INTUNIV) 1 MG TB24 ER tablet Take 1 tablet (1 mg total) by mouth every morning.   [DISCONTINUED] guanFACINE (INTUNIV) 2 MG TB24 ER tablet Take 1 tablet (2 mg total) by mouth at bedtime.   guanFACINE (INTUNIV) 1 MG TB24 ER tablet Take 1 tablet (1 mg total) by mouth every morning.   guanFACINE (INTUNIV) 2 MG TB24 ER tablet Take 1 tablet (2 mg total) by mouth at bedtime.   omeprazole (PRILOSEC) 40 MG capsule Take 1 capsule (40 mg total) by mouth 2 (two) times daily before a meal. (Patient not taking: Reported on 03/08/2022)   No facility-administered encounter medications on file as of 03/02/2023.    Past Medical History:  Diagnosis Date   Autism    Microdeletion syndrome     Past Surgical History:  Procedure Laterality Date   BIOPSY  03/29/2021   Procedure: BIOPSY;  Surgeon: Lanelle Bal, DO;  [facility];  Service: Endoscopy;;  esophageal   BIOPSY  07/07/2021   Procedure: BIOPSY;  Surgeon: Lanelle Bal, DO;  [facility];  Service: Endoscopy;;   ESOPHAGOGASTRODUODENOSCOPY (EGD) WITH PROPOFOL N/A 03/29/2021   Procedure: ESOPHAGOGASTRODUODENOSCOPY (EGD) WITH PROPOFOL;  Surgeon: Lanelle Bal, DO;  [facility];  Service: Endoscopy;  Laterality: N/A;   ESOPHAGOGASTRODUODENOSCOPY (EGD) WITH PROPOFOL N/A 07/07/2021   Procedure: ESOPHAGOGASTRODUODENOSCOPY (EGD) WITH PROPOFOL;  Surgeon: Lanelle Bal, DO;   [facility];  Service: Endoscopy;  Laterality: N/A;  2:00pm    History reviewed. No pertinent family history.  Social History   Socioeconomic History   Marital status: Single    Spouse name: Not on file   Number of children: Not on file   Years of education: Not on file   Highest education level: Not on file  Occupational History   Not on file  Tobacco Use   Smoking status: Never   Smokeless tobacco: Never   Tobacco comments:    mom and dad outside   Vaping Use   Vaping status: Never Used  Substance and Sexual Activity   Alcohol use: No    Alcohol/week: 0.0 standard drinks of alcohol   Drug use: No   Sexual activity: Never  Other Topics Concern   Not on file  Social History Narrative   Not on file   Social Determinants of Health   Financial Resource Strain: Low Risk  (03/02/2023)   Overall Financial Resource Strain (CARDIA)    Difficulty of Paying Living Expenses: Not hard at all  Food Insecurity: No Food Insecurity (03/02/2023)   Hunger Vital Sign    Worried About Running Out of Food in the Last Year: Never true    Ran Out of Food in the Last Year: Never true  Transportation Needs: No Transportation Needs (03/02/2023)   PRAPARE - Administrator, Civil Service (Medical): No  Lack of Transportation (Non-Medical): No  Physical Activity: Sufficiently Active (03/02/2023)   Exercise Vital Sign    Days of Exercise per Week: 5 days    Minutes of Exercise per Session: 30 min  Stress: No Stress Concern Present (03/02/2023)   [employer] of Occupational Health - Occupational Stress Questionnaire    Feeling of Stress : Not at all  Social Connections: Moderately Isolated (03/02/2023)   Social Connection and Isolation Panel [NHANES]    Frequency of Communication with Friends and Family: More than three times a week    Frequency of Social Gatherings with Friends and Family: Once a week    Attends Religious Services: More than 4 times per year     Active Member of [employer] or Organizations: No    Attends Banker Meetings: Never    Marital Status: Never married  Intimate Partner Violence: Not At Risk (03/02/2023)   Humiliation, Afraid, Rape, and Kick questionnaire    Fear of Current or Ex-Partner: No    Emotionally Abused: No    Physically Abused: No    Sexually Abused: No    Review of Systems  All other systems reviewed and are negative.       Objective    BP 139/77 (BP Location: Right Arm, Patient Position: Sitting, Cuff Size: Large)   Pulse (!) 103   Temp 98 F (36.7 C) (Oral)   Resp 18   Ht 5\' 6"  (1.676 m)   Wt 256 lb (116.1 kg)   SpO2 95%   BMI 41.32 kg/m   Physical Exam Vitals and nursing note reviewed.  Constitutional:      General: He is not in acute distress. Cardiovascular:     Rate and Rhythm: Normal rate and regular rhythm.  Pulmonary:     Effort: Pulmonary effort is normal.     Breath sounds: Normal breath sounds.  Abdominal:     Palpations: Abdomen is soft.     Tenderness: There is no abdominal tenderness.  Neurological:     General: No focal deficit present.     Mental Status: He is alert and oriented to person, place, and time.  Psychiatric:        Speech: Speech normal.        Behavior: Behavior is cooperative.         Assessment & Plan:   Attention deficit hyperactivity disorder (ADHD), combined type -     guanFACINE HCl ER; Take 1 tablet (2 mg total) by mouth at bedtime.  Dispense: 90 tablet; Refill: 1 -     guanFACINE HCl ER; Take 1 tablet (1 mg total) by mouth every morning.  Dispense: 90 tablet; Refill: 1  Autism -     guanFACINE HCl ER; Take 1 tablet (2 mg total) by mouth at bedtime.  Dispense: 90 tablet; Refill: 1 -     guanFACINE HCl ER; Take 1 tablet (1 mg total) by mouth every morning.  Dispense: 90 tablet; Refill: 1  Encounter for immunization -     Flu vaccine trivalent PF, 6mos and older(Flulaval,Afluria,Fluarix,Fluzone)  Immunization due -     Tdap  vaccine greater than or equal to 7yo IM     Return in about 6 months (around 08/30/2023) for follow up.   Tommie Raymond, MD

## 2023-08-31 ENCOUNTER — Ambulatory Visit (INDEPENDENT_AMBULATORY_CARE_PROVIDER_SITE_OTHER): Payer: MEDICAID | Admitting: Family Medicine

## 2023-08-31 VITALS — BP 138/81 | HR 103 | Wt 255.0 lb

## 2023-08-31 DIAGNOSIS — F840 Autistic disorder: Secondary | ICD-10-CM

## 2023-08-31 DIAGNOSIS — F959 Tic disorder, unspecified: Secondary | ICD-10-CM

## 2023-08-31 DIAGNOSIS — F902 Attention-deficit hyperactivity disorder, combined type: Secondary | ICD-10-CM | POA: Diagnosis not present

## 2023-08-31 DIAGNOSIS — K219 Gastro-esophageal reflux disease without esophagitis: Secondary | ICD-10-CM

## 2023-08-31 DIAGNOSIS — M25512 Pain in left shoulder: Secondary | ICD-10-CM

## 2023-08-31 MED ORDER — GUANFACINE HCL ER 2 MG PO TB24: 2.0000 mg | ORAL_TABLET | Freq: Every day | ORAL | 1 refills | Status: AC

## 2023-08-31 MED ORDER — GUANFACINE HCL ER 1 MG PO TB24: 1.0000 mg | ORAL_TABLET | Freq: Every morning | ORAL | 1 refills | Status: AC

## 2023-09-01 ENCOUNTER — Encounter: Payer: Self-pay | Admitting: Family Medicine

## 2023-09-01 NOTE — Progress Notes (Signed)
 Established Patient Office Visit  Subjective    Patient ID: Jacob [name], male    DOB: 2000-05-06  Age: 23 y.o. MRN: [id number]  CC:  Chief Complaint  Patient presents with   Medical Management of Chronic Issues   Shoulder Pain    HPI Jacob [name] presents for follow up of chronic med issues including ADHD and for compliant of left shoulder pain. Patient denies known trauma or injury but reports sx for about 2 months that are persistent.   Outpatient Encounter Medications as of 08/31/2023  Medication Sig   [DISCONTINUED] guanFACINE  (INTUNIV ) 1 MG TB24 ER tablet Take 1 tablet (1 mg total) by mouth every morning.   [DISCONTINUED] guanFACINE  (INTUNIV ) 2 MG TB24 ER tablet Take 1 tablet (2 mg total) by mouth at bedtime.   cetirizine  (ZYRTEC ) 10 MG tablet TAKE ONE TABLET BY MOUTH DAILY   guanFACINE  (INTUNIV ) 1 MG TB24 ER tablet Take 1 tablet (1 mg total) by mouth every morning.   guanFACINE  (INTUNIV ) 2 MG TB24 ER tablet Take 1 tablet (2 mg total) by mouth at bedtime.   melatonin 1 MG TABS tablet Take by mouth.   omeprazole  (PRILOSEC) 40 MG capsule Take 1 capsule (40 mg total) by mouth 2 (two) times daily before a meal. (Patient not taking: Reported on 03/08/2022)   No facility-administered encounter medications on file as of 08/31/2023.    Past Medical History:  Diagnosis Date   Autism    Microdeletion syndrome     Past Surgical History:  Procedure Laterality Date   BIOPSY  03/29/2021   Procedure: BIOPSY;  Surgeon: Vinetta Greening, DO;  [facility];  Service: Endoscopy;;  esophageal   BIOPSY  07/07/2021   Procedure: BIOPSY;  Surgeon: Vinetta Greening, DO;  [facility];  Service: Endoscopy;;   ESOPHAGOGASTRODUODENOSCOPY (EGD) WITH PROPOFOL  N/A 03/29/2021   Procedure: ESOPHAGOGASTRODUODENOSCOPY (EGD) WITH PROPOFOL ;  Surgeon: Vinetta Greening, DO;  [facility];  Service: Endoscopy;  Laterality: N/A;   ESOPHAGOGASTRODUODENOSCOPY (EGD) WITH  PROPOFOL  N/A 07/07/2021   Procedure: ESOPHAGOGASTRODUODENOSCOPY (EGD) WITH PROPOFOL ;  Surgeon: Vinetta Greening, DO;  [facility];  Service: Endoscopy;  Laterality: N/A;  2:00pm    No family history on file.  Social History   Socioeconomic History   Marital status: Single    Spouse name: Not on file   Number of children: Not on file   Years of education: Not on file   Highest education level: Not on file  Occupational History   Not on file  Tobacco Use   Smoking status: Never   Smokeless tobacco: Never   Tobacco comments:    mom and dad outside   Vaping Use   Vaping status: Never Used  Substance and Sexual Activity   Alcohol use: No    Alcohol/week: 0.0 standard drinks of alcohol   Drug use: No   Sexual activity: Never  Other Topics Concern   Not on file  Social History Narrative   Not on file   Social Drivers of Health   Financial Resource Strain: Low Risk  (03/02/2023)   Overall Financial Resource Strain (CARDIA)    Difficulty of Paying Living Expenses: Not hard at all  Food Insecurity: No Food Insecurity (03/02/2023)   Hunger Vital Sign    Worried About Running Out of Food in the Last Year: Never true    Ran Out of Food in the Last Year: Never true  Transportation Needs: No Transportation Needs (  03/02/2023)   PRAPARE - Administrator, Civil Service (Medical): No    Lack of Transportation (Non-Medical): No  Physical Activity: Sufficiently Active (03/02/2023)   Exercise Vital Sign    Days of Exercise per Week: 5 days    Minutes of Exercise per Session: 30 min  Stress: No Stress Concern Present (03/02/2023)   [employer] of Occupational Health - Occupational Stress Questionnaire    Feeling of Stress : Not at all  Social Connections: Moderately Isolated (03/02/2023)   Social Connection and Isolation Panel [NHANES]    Frequency of Communication with Friends and Family: More than three times a week    Frequency of Social Gatherings  with Friends and Family: Once a week    Attends Religious Services: More than 4 times per year    Active Member of [employer] or Organizations: No    Attends Banker Meetings: Never    Marital Status: Never married  Intimate Partner Violence: Not At Risk (03/02/2023)   Humiliation, Afraid, Rape, and Kick questionnaire    Fear of Current or Ex-Partner: No    Emotionally Abused: No    Physically Abused: No    Sexually Abused: No    Review of Systems  All other systems reviewed and are negative.       Objective    BP 138/81 (BP Location: Right Arm, Patient Position: Sitting, Cuff Size: Large)   Pulse (!) 103   Wt 255 lb (115.7 kg)   SpO2 97%   BMI 41.16 kg/m   Physical Exam Vitals and nursing note reviewed.  Constitutional:      General: He is not in acute distress. Cardiovascular:     Rate and Rhythm: Normal rate and regular rhythm.  Pulmonary:     Effort: Pulmonary effort is normal.     Breath sounds: Normal breath sounds.  Abdominal:     Palpations: Abdomen is soft.     Tenderness: There is no abdominal tenderness.  Musculoskeletal:     Left shoulder: Tenderness present. No deformity. Normal range of motion.  Neurological:     General: No focal deficit present.     Mental Status: He is alert and oriented to person, place, and time.         Assessment & Plan:   Attention deficit hyperactivity disorder (ADHD), combined type -     guanFACINE  HCl ER; Take 1 tablet (1 mg total) by mouth every morning.  Dispense: 90 tablet; Refill: 1 -     guanFACINE  HCl ER; Take 1 tablet (2 mg total) by mouth at bedtime.  Dispense: 90 tablet; Refill: 1  Autism -     guanFACINE  HCl ER; Take 1 tablet (1 mg total) by mouth every morning.  Dispense: 90 tablet; Refill: 1 -     guanFACINE  HCl ER; Take 1 tablet (2 mg total) by mouth at bedtime.  Dispense: 90 tablet; Refill: 1  Tic disorder  Chronic GERD  Left shoulder pain, unspecified chronicity -     Ambulatory  referral to Orthopedic Surgery     Return in about 6 months (around 03/02/2024) for follow up.   Arlo Lama, MD

## 2023-11-14 ENCOUNTER — Telehealth: Payer: Self-pay

## 2023-11-14 NOTE — Telephone Encounter (Signed)
 Please advise if willing to write jury duty excuse   Copied from CRM [id number]. Topic: General - Other >> Nov 14, 2023  3:13 PM Mia F wrote: Reason for CRM: Pt father Oneil is calling in stating that pt has jury duty on August 11th but pt and his father both do not think pt will be fit for it. They are asking if pt can get something to be excused from it. Mark [phone] or pt [phone]. Please advise

## 2023-11-22 ENCOUNTER — Encounter: Payer: Self-pay | Admitting: Family Medicine

## 2023-11-22 ENCOUNTER — Telehealth: Payer: Self-pay

## 2023-11-22 NOTE — Telephone Encounter (Signed)
 Pt's dad called to follow up on letter. He will pick up letter today from 2pm to 5pm.

## 2023-11-22 NOTE — Telephone Encounter (Signed)
 Referral to ortho you placed in may was cancelled due to pt not answering phone for scheduling. Please place a new referral to ortho for the patient   Copied from CRM #[id number]. Topic: Referral - Request for Referral >> Nov 22, 2023 11:53 AM Jacob [name] wrote: Did the patient discuss referral with their provider in the last year? Yes (If No - schedule appointment) (If Yes - send message)  Appointment offered? Yes  Type of order/referral and detailed reason for visit: Orthopedic   Preference of office, provider, location: any   If referral order, have you been seen by this specialty before? Yes, pt did not answer schduling call  (If Yes, this issue or another issue? When? Where?  Can we respond through MyChart? No

## 2023-11-23 NOTE — Telephone Encounter (Signed)
 Pt picked up letter.

## 2024-01-19 ENCOUNTER — Encounter: Payer: Self-pay | Admitting: Family Medicine

## 2024-02-07 ENCOUNTER — Encounter: Payer: Self-pay | Admitting: Family Medicine

## 2024-02-07 ENCOUNTER — Ambulatory Visit: Payer: MEDICAID | Admitting: Family Medicine

## 2024-02-07 VITALS — BP 131/81 | HR 105 | Ht 66.0 in | Wt 246.4 lb

## 2024-02-07 DIAGNOSIS — Z130 Encounter for screening for diseases of the blood and blood-forming organs and certain disorders involving the immune mechanism: Secondary | ICD-10-CM | POA: Diagnosis not present

## 2024-02-07 DIAGNOSIS — Z0000 Encounter for general adult medical examination without abnormal findings: Secondary | ICD-10-CM

## 2024-02-07 DIAGNOSIS — Z136 Encounter for screening for cardiovascular disorders: Secondary | ICD-10-CM | POA: Diagnosis not present

## 2024-02-07 DIAGNOSIS — Z13228 Encounter for screening for other metabolic disorders: Secondary | ICD-10-CM

## 2024-02-07 DIAGNOSIS — Z1329 Encounter for screening for other suspected endocrine disorder: Secondary | ICD-10-CM | POA: Diagnosis not present

## 2024-02-07 DIAGNOSIS — Z1159 Encounter for screening for other viral diseases: Secondary | ICD-10-CM

## 2024-02-07 NOTE — Progress Notes (Signed)
 Established Patient Office Visit  Subjective    Patient ID: Jacob [name], male    DOB: December 17, 2000  Age: 23 y.o. MRN: [id number]  CC:  Chief Complaint  Patient presents with   Medical Management of Chronic Issues    HPI Siddhanth Denk presents for routine annual exam. Patient denies acute complaints.   Outpatient Encounter Medications as of 02/07/2024  Medication Sig   cetirizine  (ZYRTEC ) 10 MG tablet TAKE ONE TABLET BY MOUTH DAILY   guanFACINE  (INTUNIV ) 2 MG TB24 ER tablet Take 1 tablet (2 mg total) by mouth at bedtime.   melatonin 1 MG TABS tablet Take by mouth.   guanFACINE  (INTUNIV ) 1 MG TB24 ER tablet Take 1 tablet (1 mg total) by mouth every morning.   omeprazole  (PRILOSEC) 40 MG capsule Take 1 capsule (40 mg total) by mouth 2 (two) times daily before a meal. (Patient not taking: Reported on 03/08/2022)   No facility-administered encounter medications on file as of 02/07/2024.    Past Medical History:  Diagnosis Date   Autism    Microdeletion syndrome     Past Surgical History:  Procedure Laterality Date   BIOPSY  03/29/2021   Procedure: BIOPSY;  Surgeon: Cindie Carlin POUR, DO;  [facility];  Service: Endoscopy;;  esophageal   BIOPSY  07/07/2021   Procedure: BIOPSY;  Surgeon: Cindie Carlin POUR, DO;  [facility];  Service: Endoscopy;;   ESOPHAGOGASTRODUODENOSCOPY (EGD) WITH PROPOFOL  N/A 03/29/2021   Procedure: ESOPHAGOGASTRODUODENOSCOPY (EGD) WITH PROPOFOL ;  Surgeon: Cindie Carlin POUR, DO;  [facility];  Service: Endoscopy;  Laterality: N/A;   ESOPHAGOGASTRODUODENOSCOPY (EGD) WITH PROPOFOL  N/A 07/07/2021   Procedure: ESOPHAGOGASTRODUODENOSCOPY (EGD) WITH PROPOFOL ;  Surgeon: Cindie Carlin POUR, DO;  [facility];  Service: Endoscopy;  Laterality: N/A;  2:00pm    History reviewed. No pertinent family history.  Social History   Socioeconomic History   Marital status: Single    Spouse name: Not on file   Number of  children: Not on file   Years of education: Not on file   Highest education level: Not on file  Occupational History   Not on file  Tobacco Use   Smoking status: Never   Smokeless tobacco: Never   Tobacco comments:    mom and dad outside   Vaping Use   Vaping status: Never Used  Substance and Sexual Activity   Alcohol use: No    Alcohol/week: 0.0 standard drinks of alcohol   Drug use: No   Sexual activity: Never  Other Topics Concern   Not on file  Social History Narrative   Not on file   Social Drivers of Health   Financial Resource Strain: Low Risk  (03/02/2023)   Overall Financial Resource Strain (CARDIA)    Difficulty of Paying Living Expenses: Not hard at all  Food Insecurity: No Food Insecurity (03/02/2023)   Hunger Vital Sign    Worried About Running Out of Food in the Last Year: Never true    Ran Out of Food in the Last Year: Never true  Transportation Needs: No Transportation Needs (03/02/2023)   PRAPARE - Administrator, Civil Service (Medical): No    Lack of Transportation (Non-Medical): No  Physical Activity: Sufficiently Active (03/02/2023)   Exercise Vital Sign    Days of Exercise per Week: 5 days    Minutes of Exercise per Session: 30 min  Stress: No Stress Concern Present (03/02/2023)   [employer] of Occupational Health -  Occupational Stress Questionnaire    Feeling of Stress : Not at all  Social Connections: Moderately Isolated (03/02/2023)   Social Connection and Isolation Panel    Frequency of Communication with Friends and Family: More than three times a week    Frequency of Social Gatherings with Friends and Family: Once a week    Attends Religious Services: More than 4 times per year    Active Member of [employer] or Organizations: No    Attends Banker Meetings: Never    Marital Status: Never married  Intimate Partner Violence: Not At Risk (03/02/2023)   Humiliation, Afraid, Rape, and Kick questionnaire    Fear of  Current or Ex-Partner: No    Emotionally Abused: No    Physically Abused: No    Sexually Abused: No    Review of Systems  All other systems reviewed and are negative.       Objective    BP 131/81   Pulse (!) 105   Ht 5' 6 (1.676 m)   Wt 246 lb 6.4 oz (111.8 kg)   SpO2 94%   BMI 39.77 kg/m   Physical Exam Vitals and nursing note reviewed.  Constitutional:      General: He is not in acute distress.    Appearance: He is obese.  HENT:     Head: Normocephalic and atraumatic.     Right Ear: Tympanic membrane, ear canal and external ear normal.     Left Ear: Tympanic membrane, ear canal and external ear normal.     Nose: Nose normal.     Mouth/Throat:     Mouth: Mucous membranes are moist.     Pharynx: Oropharynx is clear.  Eyes:     Conjunctiva/sclera: Conjunctivae normal.     Pupils: Pupils are equal, round, and reactive to light.  Neck:     Thyroid : No thyromegaly.  Cardiovascular:     Rate and Rhythm: Normal rate and regular rhythm.     Heart sounds: Normal heart sounds. No murmur heard. Pulmonary:     Effort: Pulmonary effort is normal.     Breath sounds: Normal breath sounds.  Abdominal:     General: There is no distension.     Palpations: Abdomen is soft. There is no mass.     Tenderness: There is no abdominal tenderness.     Hernia: There is no hernia in the left inguinal area or right inguinal area.  Musculoskeletal:        General: Normal range of motion.     Cervical back: Normal range of motion and neck supple.     Right lower leg: No edema.     Left lower leg: No edema.  Skin:    General: Skin is warm and dry.  Neurological:     General: No focal deficit present.     Mental Status: He is alert and oriented to person, place, and time. Mental status is at baseline.  Psychiatric:        Mood and Affect: Mood normal.        Behavior: Behavior normal.         Assessment & Plan:   Annual physical exam -     Comprehensive metabolic panel with  GFR  Screening for deficiency anemia -     CBC with Differential/Platelet  Encounter for screening for cardiovascular disorders -     Lipid panel  Screening for endocrine/metabolic/immunity disorders -     Hemoglobin A1c  Need for hepatitis  C screening test -     Hepatitis C antibody     No follow-ups on file.   Tanda Raguel SQUIBB, MD

## 2024-02-08 LAB — CBC WITH DIFFERENTIAL/PLATELET
Basophils Absolute: 0.1 x10E3/uL (ref 0.0–0.2)
Basos: 1 %
EOS (ABSOLUTE): 0.3 x10E3/uL (ref 0.0–0.4)
Eos: 5 %
Hematocrit: 53.7 % — ABNORMAL HIGH (ref 37.5–51.0)
Hemoglobin: 17.7 g/dL (ref 13.0–17.7)
Immature Grans (Abs): 0 x10E3/uL (ref 0.0–0.1)
Immature Granulocytes: 0 %
Lymphocytes Absolute: 1.5 x10E3/uL (ref 0.7–3.1)
Lymphs: 20 %
MCH: 27.5 pg (ref 26.6–33.0)
MCHC: 33 g/dL (ref 31.5–35.7)
MCV: 83 fL (ref 79–97)
Monocytes Absolute: 0.4 x10E3/uL (ref 0.1–0.9)
Monocytes: 6 %
Neutrophils Absolute: 5.2 x10E3/uL (ref 1.4–7.0)
Neutrophils: 68 %
Platelets: 316 x10E3/uL (ref 150–450)
RBC: 6.44 x10E6/uL — ABNORMAL HIGH (ref 4.14–5.80)
RDW: 13.1 % (ref 11.6–15.4)
WBC: 7.6 x10E3/uL (ref 3.4–10.8)

## 2024-02-08 LAB — COMPREHENSIVE METABOLIC PANEL WITH GFR
ALT: 40 IU/L (ref 0–44)
AST: 26 IU/L (ref 0–40)
Albumin: 4.6 g/dL (ref 4.3–5.2)
Alkaline Phosphatase: 106 IU/L (ref 47–123)
BUN/Creatinine Ratio: 21 — ABNORMAL HIGH (ref 9–20)
BUN: 16 mg/dL (ref 6–20)
Bilirubin Total: 0.5 mg/dL (ref 0.0–1.2)
CO2: 23 mmol/L (ref 20–29)
Calcium: 10 mg/dL (ref 8.7–10.2)
Chloride: 103 mmol/L (ref 96–106)
Creatinine, Ser: 0.78 mg/dL (ref 0.76–1.27)
Globulin, Total: 2.9 g/dL (ref 1.5–4.5)
Glucose: 87 mg/dL (ref 70–99)
Potassium: 4.5 mmol/L (ref 3.5–5.2)
Sodium: 141 mmol/L (ref 134–144)
Total Protein: 7.5 g/dL (ref 6.0–8.5)
eGFR: 129 mL/min/1.73 (ref >59)

## 2024-02-08 LAB — LIPID PANEL
Chol/HDL Ratio: 4.6 ratio (ref 0.0–5.0)
Cholesterol, Total: 208 mg/dL — ABNORMAL HIGH (ref 100–199)
HDL: 45 mg/dL (ref >39)
LDL Chol Calc (NIH): 128 mg/dL — ABNORMAL HIGH (ref 0–99)
Triglycerides: 199 mg/dL — ABNORMAL HIGH (ref 0–149)
VLDL Cholesterol Cal: 35 mg/dL (ref 5–40)

## 2024-02-08 LAB — HEMOGLOBIN A1C
Est. average glucose Bld gHb Est-mCnc: 100 mg/dL
Hgb A1c MFr Bld: 5.1 % (ref 4.8–5.6)

## 2024-02-08 LAB — HEPATITIS C ANTIBODY: Hep C Virus Ab: NONREACTIVE

## 2024-02-29 ENCOUNTER — Ambulatory Visit: Payer: Self-pay | Admitting: Family Medicine

## 2024-03-01 ENCOUNTER — Ambulatory Visit: Payer: MEDICAID | Admitting: Family Medicine
